# Patient Record
Sex: Female | Born: 1940 | Race: White | Hispanic: No | Smoking: Former smoker
Health system: Southern US, Community
[De-identification: ages and names within clinical notes are randomized; demographics above are authoritative.]

## PROBLEM LIST (undated history)

## (undated) DIAGNOSIS — N289 Disorder of kidney and ureter, unspecified: Secondary | ICD-10-CM

## (undated) DIAGNOSIS — J449 Chronic obstructive pulmonary disease, unspecified: Secondary | ICD-10-CM

## (undated) DIAGNOSIS — I509 Heart failure, unspecified: Secondary | ICD-10-CM

## (undated) DIAGNOSIS — E119 Type 2 diabetes mellitus without complications: Secondary | ICD-10-CM

## (undated) DIAGNOSIS — I10 Essential (primary) hypertension: Secondary | ICD-10-CM

## (undated) DIAGNOSIS — I639 Cerebral infarction, unspecified: Secondary | ICD-10-CM

## (undated) DIAGNOSIS — I4891 Unspecified atrial fibrillation: Secondary | ICD-10-CM

## (undated) DIAGNOSIS — J9610 Chronic respiratory failure, unspecified whether with hypoxia or hypercapnia: Secondary | ICD-10-CM

---

## 2016-05-29 DIAGNOSIS — I10 Essential (primary) hypertension: Secondary | ICD-10-CM

## 2016-05-29 DIAGNOSIS — J441 Chronic obstructive pulmonary disease with (acute) exacerbation: Secondary | ICD-10-CM

## 2016-05-29 DIAGNOSIS — M069 Rheumatoid arthritis, unspecified: Secondary | ICD-10-CM

## 2016-05-29 DIAGNOSIS — J9601 Acute respiratory failure with hypoxia: Secondary | ICD-10-CM

## 2016-05-29 DIAGNOSIS — I509 Heart failure, unspecified: Secondary | ICD-10-CM

## 2016-05-29 DIAGNOSIS — N179 Acute kidney failure, unspecified: Secondary | ICD-10-CM | POA: Diagnosis not present

## 2016-05-29 DIAGNOSIS — N390 Urinary tract infection, site not specified: Secondary | ICD-10-CM | POA: Diagnosis not present

## 2016-05-29 DIAGNOSIS — E119 Type 2 diabetes mellitus without complications: Secondary | ICD-10-CM

## 2016-05-30 DIAGNOSIS — N179 Acute kidney failure, unspecified: Secondary | ICD-10-CM | POA: Diagnosis not present

## 2016-05-30 DIAGNOSIS — J441 Chronic obstructive pulmonary disease with (acute) exacerbation: Secondary | ICD-10-CM | POA: Diagnosis not present

## 2016-05-30 DIAGNOSIS — N390 Urinary tract infection, site not specified: Secondary | ICD-10-CM | POA: Diagnosis not present

## 2016-05-30 DIAGNOSIS — J9601 Acute respiratory failure with hypoxia: Secondary | ICD-10-CM | POA: Diagnosis not present

## 2016-05-31 DIAGNOSIS — N390 Urinary tract infection, site not specified: Secondary | ICD-10-CM | POA: Diagnosis not present

## 2016-05-31 DIAGNOSIS — J9601 Acute respiratory failure with hypoxia: Secondary | ICD-10-CM | POA: Diagnosis not present

## 2016-05-31 DIAGNOSIS — J441 Chronic obstructive pulmonary disease with (acute) exacerbation: Secondary | ICD-10-CM | POA: Diagnosis not present

## 2016-05-31 DIAGNOSIS — N179 Acute kidney failure, unspecified: Secondary | ICD-10-CM | POA: Diagnosis not present

## 2016-06-01 DIAGNOSIS — N179 Acute kidney failure, unspecified: Secondary | ICD-10-CM

## 2016-06-01 DIAGNOSIS — J440 Chronic obstructive pulmonary disease with acute lower respiratory infection: Secondary | ICD-10-CM

## 2016-06-01 DIAGNOSIS — N390 Urinary tract infection, site not specified: Secondary | ICD-10-CM

## 2016-06-01 DIAGNOSIS — J9601 Acute respiratory failure with hypoxia: Secondary | ICD-10-CM | POA: Diagnosis not present

## 2016-06-01 DIAGNOSIS — M069 Rheumatoid arthritis, unspecified: Secondary | ICD-10-CM

## 2016-06-01 DIAGNOSIS — J441 Chronic obstructive pulmonary disease with (acute) exacerbation: Secondary | ICD-10-CM | POA: Diagnosis not present

## 2016-06-01 DIAGNOSIS — J189 Pneumonia, unspecified organism: Secondary | ICD-10-CM | POA: Diagnosis not present

## 2016-06-01 DIAGNOSIS — E119 Type 2 diabetes mellitus without complications: Secondary | ICD-10-CM

## 2016-06-01 DIAGNOSIS — I509 Heart failure, unspecified: Secondary | ICD-10-CM

## 2016-06-02 DIAGNOSIS — J441 Chronic obstructive pulmonary disease with (acute) exacerbation: Secondary | ICD-10-CM | POA: Diagnosis not present

## 2016-06-02 DIAGNOSIS — N179 Acute kidney failure, unspecified: Secondary | ICD-10-CM | POA: Diagnosis not present

## 2016-06-02 DIAGNOSIS — J9601 Acute respiratory failure with hypoxia: Secondary | ICD-10-CM | POA: Diagnosis not present

## 2016-06-02 DIAGNOSIS — J189 Pneumonia, unspecified organism: Secondary | ICD-10-CM | POA: Diagnosis not present

## 2017-03-05 DIAGNOSIS — A419 Sepsis, unspecified organism: Secondary | ICD-10-CM

## 2017-03-05 DIAGNOSIS — E119 Type 2 diabetes mellitus without complications: Secondary | ICD-10-CM | POA: Diagnosis not present

## 2017-03-05 DIAGNOSIS — N390 Urinary tract infection, site not specified: Secondary | ICD-10-CM

## 2017-03-05 DIAGNOSIS — N179 Acute kidney failure, unspecified: Secondary | ICD-10-CM | POA: Diagnosis not present

## 2017-03-05 DIAGNOSIS — I509 Heart failure, unspecified: Secondary | ICD-10-CM | POA: Diagnosis not present

## 2017-03-05 DIAGNOSIS — J441 Chronic obstructive pulmonary disease with (acute) exacerbation: Secondary | ICD-10-CM | POA: Diagnosis not present

## 2017-03-05 DIAGNOSIS — J9621 Acute and chronic respiratory failure with hypoxia: Secondary | ICD-10-CM | POA: Diagnosis not present

## 2017-03-05 DIAGNOSIS — I10 Essential (primary) hypertension: Secondary | ICD-10-CM | POA: Diagnosis not present

## 2017-03-05 DIAGNOSIS — M069 Rheumatoid arthritis, unspecified: Secondary | ICD-10-CM | POA: Diagnosis not present

## 2017-03-05 DIAGNOSIS — J440 Chronic obstructive pulmonary disease with acute lower respiratory infection: Secondary | ICD-10-CM

## 2017-03-05 DIAGNOSIS — R531 Weakness: Secondary | ICD-10-CM

## 2017-03-06 DIAGNOSIS — J441 Chronic obstructive pulmonary disease with (acute) exacerbation: Secondary | ICD-10-CM | POA: Diagnosis not present

## 2017-03-06 DIAGNOSIS — J440 Chronic obstructive pulmonary disease with acute lower respiratory infection: Secondary | ICD-10-CM | POA: Diagnosis not present

## 2017-03-06 DIAGNOSIS — M069 Rheumatoid arthritis, unspecified: Secondary | ICD-10-CM | POA: Diagnosis not present

## 2017-03-06 DIAGNOSIS — R531 Weakness: Secondary | ICD-10-CM | POA: Diagnosis not present

## 2017-03-06 DIAGNOSIS — E119 Type 2 diabetes mellitus without complications: Secondary | ICD-10-CM | POA: Diagnosis not present

## 2017-03-06 DIAGNOSIS — J9621 Acute and chronic respiratory failure with hypoxia: Secondary | ICD-10-CM | POA: Diagnosis not present

## 2017-03-06 DIAGNOSIS — N390 Urinary tract infection, site not specified: Secondary | ICD-10-CM | POA: Diagnosis not present

## 2017-03-06 DIAGNOSIS — N179 Acute kidney failure, unspecified: Secondary | ICD-10-CM | POA: Diagnosis not present

## 2017-03-06 DIAGNOSIS — I10 Essential (primary) hypertension: Secondary | ICD-10-CM | POA: Diagnosis not present

## 2017-03-06 DIAGNOSIS — A419 Sepsis, unspecified organism: Secondary | ICD-10-CM | POA: Diagnosis not present

## 2017-03-06 DIAGNOSIS — I509 Heart failure, unspecified: Secondary | ICD-10-CM | POA: Diagnosis not present

## 2017-03-07 DIAGNOSIS — J440 Chronic obstructive pulmonary disease with acute lower respiratory infection: Secondary | ICD-10-CM | POA: Diagnosis not present

## 2017-03-07 DIAGNOSIS — J441 Chronic obstructive pulmonary disease with (acute) exacerbation: Secondary | ICD-10-CM | POA: Diagnosis not present

## 2017-03-07 DIAGNOSIS — J9621 Acute and chronic respiratory failure with hypoxia: Secondary | ICD-10-CM | POA: Diagnosis not present

## 2017-03-07 DIAGNOSIS — N390 Urinary tract infection, site not specified: Secondary | ICD-10-CM | POA: Diagnosis not present

## 2017-03-07 DIAGNOSIS — I509 Heart failure, unspecified: Secondary | ICD-10-CM | POA: Diagnosis not present

## 2017-03-07 DIAGNOSIS — R531 Weakness: Secondary | ICD-10-CM | POA: Diagnosis not present

## 2017-03-07 DIAGNOSIS — E119 Type 2 diabetes mellitus without complications: Secondary | ICD-10-CM | POA: Diagnosis not present

## 2017-03-07 DIAGNOSIS — M069 Rheumatoid arthritis, unspecified: Secondary | ICD-10-CM | POA: Diagnosis not present

## 2017-03-07 DIAGNOSIS — I10 Essential (primary) hypertension: Secondary | ICD-10-CM | POA: Diagnosis not present

## 2017-03-07 DIAGNOSIS — N179 Acute kidney failure, unspecified: Secondary | ICD-10-CM | POA: Diagnosis not present

## 2017-03-07 DIAGNOSIS — A419 Sepsis, unspecified organism: Secondary | ICD-10-CM | POA: Diagnosis not present

## 2017-03-08 DIAGNOSIS — A419 Sepsis, unspecified organism: Secondary | ICD-10-CM | POA: Diagnosis not present

## 2017-03-08 DIAGNOSIS — N390 Urinary tract infection, site not specified: Secondary | ICD-10-CM | POA: Diagnosis not present

## 2017-03-08 DIAGNOSIS — M069 Rheumatoid arthritis, unspecified: Secondary | ICD-10-CM | POA: Diagnosis not present

## 2017-03-08 DIAGNOSIS — R531 Weakness: Secondary | ICD-10-CM | POA: Diagnosis not present

## 2017-03-08 DIAGNOSIS — J9621 Acute and chronic respiratory failure with hypoxia: Secondary | ICD-10-CM | POA: Diagnosis not present

## 2017-03-08 DIAGNOSIS — E119 Type 2 diabetes mellitus without complications: Secondary | ICD-10-CM | POA: Diagnosis not present

## 2017-03-08 DIAGNOSIS — N179 Acute kidney failure, unspecified: Secondary | ICD-10-CM | POA: Diagnosis not present

## 2017-03-08 DIAGNOSIS — J440 Chronic obstructive pulmonary disease with acute lower respiratory infection: Secondary | ICD-10-CM | POA: Diagnosis not present

## 2017-03-08 DIAGNOSIS — J441 Chronic obstructive pulmonary disease with (acute) exacerbation: Secondary | ICD-10-CM | POA: Diagnosis not present

## 2017-03-08 DIAGNOSIS — I509 Heart failure, unspecified: Secondary | ICD-10-CM | POA: Diagnosis not present

## 2017-03-08 DIAGNOSIS — I10 Essential (primary) hypertension: Secondary | ICD-10-CM | POA: Diagnosis not present

## 2017-03-24 DIAGNOSIS — J189 Pneumonia, unspecified organism: Secondary | ICD-10-CM

## 2017-03-24 DIAGNOSIS — I639 Cerebral infarction, unspecified: Secondary | ICD-10-CM | POA: Diagnosis not present

## 2017-03-24 DIAGNOSIS — R4182 Altered mental status, unspecified: Secondary | ICD-10-CM | POA: Diagnosis not present

## 2017-03-24 DIAGNOSIS — J9621 Acute and chronic respiratory failure with hypoxia: Secondary | ICD-10-CM

## 2017-03-25 DIAGNOSIS — R4182 Altered mental status, unspecified: Secondary | ICD-10-CM | POA: Diagnosis not present

## 2017-03-25 DIAGNOSIS — J189 Pneumonia, unspecified organism: Secondary | ICD-10-CM | POA: Diagnosis not present

## 2017-03-25 DIAGNOSIS — I639 Cerebral infarction, unspecified: Secondary | ICD-10-CM | POA: Diagnosis not present

## 2017-03-25 DIAGNOSIS — J9621 Acute and chronic respiratory failure with hypoxia: Secondary | ICD-10-CM | POA: Diagnosis not present

## 2017-03-26 DIAGNOSIS — J189 Pneumonia, unspecified organism: Secondary | ICD-10-CM | POA: Diagnosis not present

## 2017-03-26 DIAGNOSIS — I639 Cerebral infarction, unspecified: Secondary | ICD-10-CM | POA: Diagnosis not present

## 2017-03-26 DIAGNOSIS — R0602 Shortness of breath: Secondary | ICD-10-CM

## 2017-03-26 DIAGNOSIS — R4182 Altered mental status, unspecified: Secondary | ICD-10-CM | POA: Diagnosis not present

## 2017-03-26 DIAGNOSIS — J9621 Acute and chronic respiratory failure with hypoxia: Secondary | ICD-10-CM | POA: Diagnosis not present

## 2017-03-27 DIAGNOSIS — J9621 Acute and chronic respiratory failure with hypoxia: Secondary | ICD-10-CM | POA: Diagnosis not present

## 2017-03-27 DIAGNOSIS — R4182 Altered mental status, unspecified: Secondary | ICD-10-CM | POA: Diagnosis not present

## 2017-03-27 DIAGNOSIS — R0602 Shortness of breath: Secondary | ICD-10-CM

## 2017-03-27 DIAGNOSIS — J189 Pneumonia, unspecified organism: Secondary | ICD-10-CM | POA: Diagnosis not present

## 2017-03-27 DIAGNOSIS — I639 Cerebral infarction, unspecified: Secondary | ICD-10-CM | POA: Diagnosis not present

## 2017-03-28 DIAGNOSIS — R4182 Altered mental status, unspecified: Secondary | ICD-10-CM | POA: Diagnosis not present

## 2017-03-28 DIAGNOSIS — J189 Pneumonia, unspecified organism: Secondary | ICD-10-CM | POA: Diagnosis not present

## 2017-03-28 DIAGNOSIS — I639 Cerebral infarction, unspecified: Secondary | ICD-10-CM | POA: Diagnosis not present

## 2017-03-28 DIAGNOSIS — J9621 Acute and chronic respiratory failure with hypoxia: Secondary | ICD-10-CM | POA: Diagnosis not present

## 2017-03-29 DIAGNOSIS — J189 Pneumonia, unspecified organism: Secondary | ICD-10-CM | POA: Diagnosis not present

## 2017-03-29 DIAGNOSIS — R4182 Altered mental status, unspecified: Secondary | ICD-10-CM | POA: Diagnosis not present

## 2017-03-29 DIAGNOSIS — J9621 Acute and chronic respiratory failure with hypoxia: Secondary | ICD-10-CM | POA: Diagnosis not present

## 2017-03-29 DIAGNOSIS — I639 Cerebral infarction, unspecified: Secondary | ICD-10-CM | POA: Diagnosis not present

## 2017-03-30 DIAGNOSIS — I639 Cerebral infarction, unspecified: Secondary | ICD-10-CM | POA: Diagnosis not present

## 2017-03-30 DIAGNOSIS — J189 Pneumonia, unspecified organism: Secondary | ICD-10-CM | POA: Diagnosis not present

## 2017-03-30 DIAGNOSIS — R4182 Altered mental status, unspecified: Secondary | ICD-10-CM | POA: Diagnosis not present

## 2017-03-30 DIAGNOSIS — J9621 Acute and chronic respiratory failure with hypoxia: Secondary | ICD-10-CM | POA: Diagnosis not present

## 2017-04-26 ENCOUNTER — Ambulatory Visit: Payer: Medicare Other | Admitting: Neurology

## 2017-07-24 DIAGNOSIS — E119 Type 2 diabetes mellitus without complications: Secondary | ICD-10-CM | POA: Diagnosis not present

## 2017-07-24 DIAGNOSIS — R0902 Hypoxemia: Secondary | ICD-10-CM | POA: Diagnosis not present

## 2017-07-24 DIAGNOSIS — J441 Chronic obstructive pulmonary disease with (acute) exacerbation: Secondary | ICD-10-CM

## 2017-07-24 DIAGNOSIS — I10 Essential (primary) hypertension: Secondary | ICD-10-CM

## 2017-07-24 DIAGNOSIS — M069 Rheumatoid arthritis, unspecified: Secondary | ICD-10-CM | POA: Diagnosis not present

## 2017-07-25 DIAGNOSIS — E119 Type 2 diabetes mellitus without complications: Secondary | ICD-10-CM | POA: Diagnosis not present

## 2017-07-25 DIAGNOSIS — R0902 Hypoxemia: Secondary | ICD-10-CM | POA: Diagnosis not present

## 2017-07-25 DIAGNOSIS — J441 Chronic obstructive pulmonary disease with (acute) exacerbation: Secondary | ICD-10-CM | POA: Diagnosis not present

## 2017-07-25 DIAGNOSIS — I10 Essential (primary) hypertension: Secondary | ICD-10-CM | POA: Diagnosis not present

## 2017-07-25 DIAGNOSIS — M069 Rheumatoid arthritis, unspecified: Secondary | ICD-10-CM | POA: Diagnosis not present

## 2017-07-26 DIAGNOSIS — E119 Type 2 diabetes mellitus without complications: Secondary | ICD-10-CM | POA: Diagnosis not present

## 2017-07-26 DIAGNOSIS — R0902 Hypoxemia: Secondary | ICD-10-CM | POA: Diagnosis not present

## 2017-07-26 DIAGNOSIS — I10 Essential (primary) hypertension: Secondary | ICD-10-CM | POA: Diagnosis not present

## 2017-07-26 DIAGNOSIS — J441 Chronic obstructive pulmonary disease with (acute) exacerbation: Secondary | ICD-10-CM | POA: Diagnosis not present

## 2017-07-26 DIAGNOSIS — M069 Rheumatoid arthritis, unspecified: Secondary | ICD-10-CM | POA: Diagnosis not present

## 2017-07-27 DIAGNOSIS — I10 Essential (primary) hypertension: Secondary | ICD-10-CM | POA: Diagnosis not present

## 2017-07-27 DIAGNOSIS — E119 Type 2 diabetes mellitus without complications: Secondary | ICD-10-CM | POA: Diagnosis not present

## 2017-07-27 DIAGNOSIS — M069 Rheumatoid arthritis, unspecified: Secondary | ICD-10-CM | POA: Diagnosis not present

## 2017-07-27 DIAGNOSIS — R0902 Hypoxemia: Secondary | ICD-10-CM | POA: Diagnosis not present

## 2017-07-27 DIAGNOSIS — J441 Chronic obstructive pulmonary disease with (acute) exacerbation: Secondary | ICD-10-CM | POA: Diagnosis not present

## 2017-07-28 DIAGNOSIS — R0902 Hypoxemia: Secondary | ICD-10-CM | POA: Diagnosis not present

## 2017-07-28 DIAGNOSIS — M069 Rheumatoid arthritis, unspecified: Secondary | ICD-10-CM | POA: Diagnosis not present

## 2017-07-28 DIAGNOSIS — J441 Chronic obstructive pulmonary disease with (acute) exacerbation: Secondary | ICD-10-CM | POA: Diagnosis not present

## 2017-07-28 DIAGNOSIS — E119 Type 2 diabetes mellitus without complications: Secondary | ICD-10-CM | POA: Diagnosis not present

## 2017-07-28 DIAGNOSIS — I10 Essential (primary) hypertension: Secondary | ICD-10-CM | POA: Diagnosis not present

## 2017-08-27 DIAGNOSIS — J189 Pneumonia, unspecified organism: Secondary | ICD-10-CM

## 2017-08-27 DIAGNOSIS — E119 Type 2 diabetes mellitus without complications: Secondary | ICD-10-CM | POA: Diagnosis not present

## 2017-08-27 DIAGNOSIS — J9621 Acute and chronic respiratory failure with hypoxia: Secondary | ICD-10-CM

## 2017-08-27 DIAGNOSIS — R4182 Altered mental status, unspecified: Secondary | ICD-10-CM | POA: Diagnosis not present

## 2017-08-27 DIAGNOSIS — J441 Chronic obstructive pulmonary disease with (acute) exacerbation: Secondary | ICD-10-CM | POA: Diagnosis not present

## 2017-08-27 DIAGNOSIS — I10 Essential (primary) hypertension: Secondary | ICD-10-CM | POA: Diagnosis not present

## 2017-08-27 DIAGNOSIS — I639 Cerebral infarction, unspecified: Secondary | ICD-10-CM | POA: Diagnosis not present

## 2017-08-28 DIAGNOSIS — E119 Type 2 diabetes mellitus without complications: Secondary | ICD-10-CM | POA: Diagnosis not present

## 2017-08-28 DIAGNOSIS — R4182 Altered mental status, unspecified: Secondary | ICD-10-CM | POA: Diagnosis not present

## 2017-08-28 DIAGNOSIS — I10 Essential (primary) hypertension: Secondary | ICD-10-CM | POA: Diagnosis not present

## 2017-08-28 DIAGNOSIS — J189 Pneumonia, unspecified organism: Secondary | ICD-10-CM | POA: Diagnosis not present

## 2017-08-28 DIAGNOSIS — J441 Chronic obstructive pulmonary disease with (acute) exacerbation: Secondary | ICD-10-CM | POA: Diagnosis not present

## 2017-08-28 DIAGNOSIS — J9621 Acute and chronic respiratory failure with hypoxia: Secondary | ICD-10-CM | POA: Diagnosis not present

## 2017-08-28 DIAGNOSIS — I639 Cerebral infarction, unspecified: Secondary | ICD-10-CM | POA: Diagnosis not present

## 2017-08-29 DIAGNOSIS — J189 Pneumonia, unspecified organism: Secondary | ICD-10-CM | POA: Diagnosis not present

## 2017-08-29 DIAGNOSIS — J441 Chronic obstructive pulmonary disease with (acute) exacerbation: Secondary | ICD-10-CM | POA: Diagnosis not present

## 2017-08-29 DIAGNOSIS — I10 Essential (primary) hypertension: Secondary | ICD-10-CM | POA: Diagnosis not present

## 2017-08-29 DIAGNOSIS — E119 Type 2 diabetes mellitus without complications: Secondary | ICD-10-CM | POA: Diagnosis not present

## 2017-08-29 DIAGNOSIS — J9621 Acute and chronic respiratory failure with hypoxia: Secondary | ICD-10-CM | POA: Diagnosis not present

## 2017-08-29 DIAGNOSIS — R4182 Altered mental status, unspecified: Secondary | ICD-10-CM | POA: Diagnosis not present

## 2017-08-29 DIAGNOSIS — I639 Cerebral infarction, unspecified: Secondary | ICD-10-CM | POA: Diagnosis not present

## 2017-08-30 DIAGNOSIS — R4182 Altered mental status, unspecified: Secondary | ICD-10-CM | POA: Diagnosis not present

## 2017-08-30 DIAGNOSIS — I10 Essential (primary) hypertension: Secondary | ICD-10-CM | POA: Diagnosis not present

## 2017-08-30 DIAGNOSIS — E119 Type 2 diabetes mellitus without complications: Secondary | ICD-10-CM | POA: Diagnosis not present

## 2017-08-30 DIAGNOSIS — J9621 Acute and chronic respiratory failure with hypoxia: Secondary | ICD-10-CM | POA: Diagnosis not present

## 2017-08-30 DIAGNOSIS — I639 Cerebral infarction, unspecified: Secondary | ICD-10-CM | POA: Diagnosis not present

## 2017-08-30 DIAGNOSIS — J189 Pneumonia, unspecified organism: Secondary | ICD-10-CM | POA: Diagnosis not present

## 2017-08-30 DIAGNOSIS — J441 Chronic obstructive pulmonary disease with (acute) exacerbation: Secondary | ICD-10-CM | POA: Diagnosis not present

## 2017-08-31 DIAGNOSIS — J189 Pneumonia, unspecified organism: Secondary | ICD-10-CM | POA: Diagnosis not present

## 2017-08-31 DIAGNOSIS — E119 Type 2 diabetes mellitus without complications: Secondary | ICD-10-CM | POA: Diagnosis not present

## 2017-08-31 DIAGNOSIS — I639 Cerebral infarction, unspecified: Secondary | ICD-10-CM | POA: Diagnosis not present

## 2017-08-31 DIAGNOSIS — R4182 Altered mental status, unspecified: Secondary | ICD-10-CM | POA: Diagnosis not present

## 2017-08-31 DIAGNOSIS — J9621 Acute and chronic respiratory failure with hypoxia: Secondary | ICD-10-CM | POA: Diagnosis not present

## 2017-08-31 DIAGNOSIS — I10 Essential (primary) hypertension: Secondary | ICD-10-CM | POA: Diagnosis not present

## 2017-08-31 DIAGNOSIS — J441 Chronic obstructive pulmonary disease with (acute) exacerbation: Secondary | ICD-10-CM | POA: Diagnosis not present

## 2017-10-16 DIAGNOSIS — J449 Chronic obstructive pulmonary disease, unspecified: Secondary | ICD-10-CM

## 2017-10-16 DIAGNOSIS — I509 Heart failure, unspecified: Secondary | ICD-10-CM | POA: Diagnosis not present

## 2017-10-16 DIAGNOSIS — I209 Angina pectoris, unspecified: Secondary | ICD-10-CM

## 2017-10-16 DIAGNOSIS — E119 Type 2 diabetes mellitus without complications: Secondary | ICD-10-CM

## 2017-10-16 DIAGNOSIS — I10 Essential (primary) hypertension: Secondary | ICD-10-CM

## 2017-10-17 DIAGNOSIS — I10 Essential (primary) hypertension: Secondary | ICD-10-CM | POA: Diagnosis not present

## 2017-10-17 DIAGNOSIS — J449 Chronic obstructive pulmonary disease, unspecified: Secondary | ICD-10-CM | POA: Diagnosis not present

## 2017-10-17 DIAGNOSIS — D739 Disease of spleen, unspecified: Secondary | ICD-10-CM | POA: Diagnosis not present

## 2017-10-17 DIAGNOSIS — I209 Angina pectoris, unspecified: Secondary | ICD-10-CM | POA: Diagnosis not present

## 2017-10-17 DIAGNOSIS — E119 Type 2 diabetes mellitus without complications: Secondary | ICD-10-CM | POA: Diagnosis not present

## 2017-10-17 DIAGNOSIS — I509 Heart failure, unspecified: Secondary | ICD-10-CM | POA: Diagnosis not present

## 2019-01-04 ENCOUNTER — Other Ambulatory Visit: Payer: Self-pay | Admitting: Internal Medicine

## 2019-01-04 DIAGNOSIS — R200 Anesthesia of skin: Secondary | ICD-10-CM

## 2019-01-04 DIAGNOSIS — M542 Cervicalgia: Secondary | ICD-10-CM

## 2019-02-05 ENCOUNTER — Other Ambulatory Visit: Payer: Self-pay | Admitting: Internal Medicine

## 2019-02-07 ENCOUNTER — Other Ambulatory Visit: Payer: Self-pay | Admitting: Internal Medicine

## 2019-02-07 ENCOUNTER — Ambulatory Visit
Admission: RE | Admit: 2019-02-07 | Discharge: 2019-02-07 | Disposition: A | Discharge status: Home / Self Care | Payer: Medicare Other | Source: Ambulatory Visit | Attending: Internal Medicine | Admitting: Internal Medicine

## 2019-02-07 DIAGNOSIS — M542 Cervicalgia: Secondary | ICD-10-CM

## 2019-02-07 DIAGNOSIS — R200 Anesthesia of skin: Secondary | ICD-10-CM

## 2019-05-02 ENCOUNTER — Emergency Department (HOSPITAL_COMMUNITY): Payer: Medicare Other

## 2019-05-02 ENCOUNTER — Inpatient Hospital Stay (HOSPITAL_COMMUNITY)
Admission: EM | Admit: 2019-05-02 | Discharge: 2019-05-07 | DRG: 177 | Disposition: A | Discharge status: Home / Self Care | Payer: Medicare Other | Source: Skilled Nursing Facility | Attending: Internal Medicine | Admitting: Internal Medicine

## 2019-05-02 ENCOUNTER — Other Ambulatory Visit: Payer: Self-pay

## 2019-05-02 ENCOUNTER — Encounter (HOSPITAL_COMMUNITY): Payer: Self-pay | Admitting: Emergency Medicine

## 2019-05-02 DIAGNOSIS — I130 Hypertensive heart and chronic kidney disease with heart failure and stage 1 through stage 4 chronic kidney disease, or unspecified chronic kidney disease: Secondary | ICD-10-CM | POA: Diagnosis present

## 2019-05-02 DIAGNOSIS — Z7952 Long term (current) use of systemic steroids: Secondary | ICD-10-CM | POA: Diagnosis not present

## 2019-05-02 DIAGNOSIS — J9621 Acute and chronic respiratory failure with hypoxia: Secondary | ICD-10-CM | POA: Diagnosis present

## 2019-05-02 DIAGNOSIS — Z79899 Other long term (current) drug therapy: Secondary | ICD-10-CM | POA: Diagnosis not present

## 2019-05-02 DIAGNOSIS — Z8679 Personal history of other diseases of the circulatory system: Secondary | ICD-10-CM | POA: Diagnosis not present

## 2019-05-02 DIAGNOSIS — I10 Essential (primary) hypertension: Secondary | ICD-10-CM | POA: Diagnosis not present

## 2019-05-02 DIAGNOSIS — I69391 Dysphagia following cerebral infarction: Secondary | ICD-10-CM | POA: Diagnosis not present

## 2019-05-02 DIAGNOSIS — Z885 Allergy status to narcotic agent status: Secondary | ICD-10-CM

## 2019-05-02 DIAGNOSIS — I509 Heart failure, unspecified: Secondary | ICD-10-CM | POA: Diagnosis present

## 2019-05-02 DIAGNOSIS — U071 COVID-19: Secondary | ICD-10-CM | POA: Diagnosis present

## 2019-05-02 DIAGNOSIS — R0902 Hypoxemia: Secondary | ICD-10-CM | POA: Diagnosis present

## 2019-05-02 DIAGNOSIS — E1122 Type 2 diabetes mellitus with diabetic chronic kidney disease: Secondary | ICD-10-CM | POA: Diagnosis present

## 2019-05-02 DIAGNOSIS — Z79891 Long term (current) use of opiate analgesic: Secondary | ICD-10-CM | POA: Diagnosis not present

## 2019-05-02 DIAGNOSIS — K219 Gastro-esophageal reflux disease without esophagitis: Secondary | ICD-10-CM | POA: Diagnosis present

## 2019-05-02 DIAGNOSIS — Z881 Allergy status to other antibiotic agents status: Secondary | ICD-10-CM | POA: Diagnosis not present

## 2019-05-02 DIAGNOSIS — R001 Bradycardia, unspecified: Secondary | ICD-10-CM | POA: Diagnosis not present

## 2019-05-02 DIAGNOSIS — I69398 Other sequelae of cerebral infarction: Secondary | ICD-10-CM | POA: Diagnosis not present

## 2019-05-02 DIAGNOSIS — J440 Chronic obstructive pulmonary disease with acute lower respiratory infection: Secondary | ICD-10-CM | POA: Diagnosis present

## 2019-05-02 DIAGNOSIS — E1142 Type 2 diabetes mellitus with diabetic polyneuropathy: Secondary | ICD-10-CM | POA: Diagnosis present

## 2019-05-02 DIAGNOSIS — F329 Major depressive disorder, single episode, unspecified: Secondary | ICD-10-CM | POA: Diagnosis present

## 2019-05-02 DIAGNOSIS — J1289 Other viral pneumonia: Secondary | ICD-10-CM | POA: Diagnosis present

## 2019-05-02 DIAGNOSIS — I480 Paroxysmal atrial fibrillation: Secondary | ICD-10-CM | POA: Diagnosis present

## 2019-05-02 DIAGNOSIS — N184 Chronic kidney disease, stage 4 (severe): Secondary | ICD-10-CM | POA: Diagnosis present

## 2019-05-02 DIAGNOSIS — D696 Thrombocytopenia, unspecified: Secondary | ICD-10-CM | POA: Diagnosis present

## 2019-05-02 DIAGNOSIS — F419 Anxiety disorder, unspecified: Secondary | ICD-10-CM | POA: Diagnosis present

## 2019-05-02 DIAGNOSIS — K2100 Gastro-esophageal reflux disease with esophagitis, without bleeding: Secondary | ICD-10-CM | POA: Diagnosis not present

## 2019-05-02 DIAGNOSIS — Z9981 Dependence on supplemental oxygen: Secondary | ICD-10-CM

## 2019-05-02 DIAGNOSIS — Z66 Do not resuscitate: Secondary | ICD-10-CM | POA: Diagnosis not present

## 2019-05-02 DIAGNOSIS — Z886 Allergy status to analgesic agent status: Secondary | ICD-10-CM

## 2019-05-02 DIAGNOSIS — Z87891 Personal history of nicotine dependence: Secondary | ICD-10-CM | POA: Diagnosis not present

## 2019-05-02 DIAGNOSIS — J1282 Pneumonia due to coronavirus disease 2019: Secondary | ICD-10-CM | POA: Diagnosis present

## 2019-05-02 HISTORY — DX: Disorder of kidney and ureter, unspecified: N28.9

## 2019-05-02 HISTORY — DX: Chronic obstructive pulmonary disease, unspecified: J44.9

## 2019-05-02 HISTORY — DX: Heart failure, unspecified: I50.9

## 2019-05-02 HISTORY — DX: Unspecified atrial fibrillation: I48.91

## 2019-05-02 HISTORY — DX: Essential (primary) hypertension: I10

## 2019-05-02 HISTORY — DX: Cerebral infarction, unspecified: I63.9

## 2019-05-02 HISTORY — DX: Type 2 diabetes mellitus without complications: E11.9

## 2019-05-02 HISTORY — DX: Chronic respiratory failure, unspecified whether with hypoxia or hypercapnia: J96.10

## 2019-05-02 LAB — POCT I-STAT 7, (LYTES, BLD GAS, ICA,H+H)
Acid-Base Excess: 6 mmol/L — ABNORMAL HIGH (ref 0.0–2.0)
Bicarbonate: 32 mmol/L — ABNORMAL HIGH (ref 20.0–28.0)
Calcium, Ion: 1.15 mmol/L (ref 1.15–1.40)
HCT: 35 % — ABNORMAL LOW (ref 36.0–46.0)
Hemoglobin: 11.9 g/dL — ABNORMAL LOW (ref 12.0–15.0)
O2 Saturation: 99 %
Patient temperature: 98.6
Potassium: 3.7 mmol/L (ref 3.5–5.1)
Sodium: 136 mmol/L (ref 135–145)
TCO2: 34 mmol/L — ABNORMAL HIGH (ref 22–32)
pCO2 arterial: 51.5 mmHg — ABNORMAL HIGH (ref 32.0–48.0)
pH, Arterial: 7.402 (ref 7.350–7.450)
pO2, Arterial: 139 mmHg — ABNORMAL HIGH (ref 83.0–108.0)

## 2019-05-02 LAB — COMPREHENSIVE METABOLIC PANEL
ALT: 25 U/L (ref 0–44)
AST: 30 U/L (ref 15–41)
Albumin: 3.1 g/dL — ABNORMAL LOW (ref 3.5–5.0)
Alkaline Phosphatase: 60 U/L (ref 38–126)
Anion gap: 10 (ref 5–15)
BUN: 28 mg/dL — ABNORMAL HIGH (ref 8–23)
CO2: 30 mmol/L (ref 22–32)
Calcium: 8.2 mg/dL — ABNORMAL LOW (ref 8.9–10.3)
Chloride: 97 mmol/L — ABNORMAL LOW (ref 98–111)
Creatinine, Ser: 2.01 mg/dL — ABNORMAL HIGH (ref 0.44–1.00)
GFR calc Af Amer: 27 mL/min — ABNORMAL LOW (ref >60)
GFR calc non Af Amer: 23 mL/min — ABNORMAL LOW (ref >60)
Glucose, Bld: 120 mg/dL — ABNORMAL HIGH (ref 70–99)
Potassium: 4 mmol/L (ref 3.5–5.1)
Sodium: 137 mmol/L (ref 135–145)
Total Bilirubin: 0.9 mg/dL (ref 0.3–1.2)
Total Protein: 6.1 g/dL — ABNORMAL LOW (ref 6.5–8.1)

## 2019-05-02 LAB — BRAIN NATRIURETIC PEPTIDE: B Natriuretic Peptide: 214.4 pg/mL — ABNORMAL HIGH (ref 0.0–100.0)

## 2019-05-02 LAB — LACTIC ACID, PLASMA
Lactic Acid, Venous: 1.2 mmol/L (ref 0.5–1.9)
Lactic Acid, Venous: 0.8 mmol/L (ref 0.5–1.9)

## 2019-05-02 LAB — CBC
HCT: 37 % (ref 36.0–46.0)
Hemoglobin: 12 g/dL (ref 12.0–15.0)
MCH: 31.4 pg (ref 26.0–34.0)
MCHC: 32.4 g/dL (ref 30.0–36.0)
MCV: 96.9 fL (ref 80.0–100.0)
Platelets: 146 10*3/uL — ABNORMAL LOW (ref 150–400)
RBC: 3.82 MIL/uL — ABNORMAL LOW (ref 3.87–5.11)
RDW: 17.1 % — ABNORMAL HIGH (ref 11.5–15.5)
WBC: 7.6 10*3/uL (ref 4.0–10.5)
nRBC: 0 % (ref 0.0–0.2)

## 2019-05-02 LAB — FERRITIN: Ferritin: 66 ng/mL (ref 11–307)

## 2019-05-02 LAB — C-REACTIVE PROTEIN: CRP: 7.6 mg/dL — ABNORMAL HIGH (ref <1.0)

## 2019-05-02 LAB — TRIGLYCERIDES: Triglycerides: 88 mg/dL

## 2019-05-02 LAB — D-DIMER, QUANTITATIVE: D-Dimer, Quant: 1.11 ug/mL-FEU — ABNORMAL HIGH (ref 0.00–0.50)

## 2019-05-02 LAB — HEPATITIS B SURFACE ANTIGEN: Hepatitis B Surface Ag: NONREACTIVE

## 2019-05-02 LAB — TROPONIN I (HIGH SENSITIVITY)
Troponin I (High Sensitivity): 15 ng/L (ref <18)
Troponin I (High Sensitivity): 14 ng/L (ref <18)

## 2019-05-02 LAB — PROCALCITONIN: Procalcitonin: 0.13 ng/mL

## 2019-05-02 LAB — FIBRINOGEN: Fibrinogen: 551 mg/dL — ABNORMAL HIGH (ref 210–475)

## 2019-05-02 LAB — CBG MONITORING, ED
Glucose-Capillary: 91 mg/dL (ref 70–99)
Glucose-Capillary: 127 mg/dL — ABNORMAL HIGH (ref 70–99)

## 2019-05-02 LAB — HEMOGLOBIN A1C
Hgb A1c MFr Bld: 6.3 % — ABNORMAL HIGH (ref 4.8–5.6)
Mean Plasma Glucose: 134.11 mg/dL

## 2019-05-02 LAB — ABO/RH: ABO/RH(D): A POS

## 2019-05-02 LAB — POC SARS CORONAVIRUS 2 AG -  ED: SARS Coronavirus 2 Ag: POSITIVE — AB

## 2019-05-02 LAB — LACTATE DEHYDROGENASE: LDH: 209 U/L — ABNORMAL HIGH (ref 98–192)

## 2019-05-02 MED ORDER — SODIUM CHLORIDE 0.9% FLUSH
3.0000 mL | Freq: Two times a day (BID) | INTRAVENOUS | Status: DC
  Administered 2019-05-02 – 2019-05-07 (×10): 3 mL via INTRAVENOUS

## 2019-05-02 MED ORDER — LATANOPROST 0.005 % OP SOLN
1.0000 [drp] | Freq: Every day | OPHTHALMIC | Status: DC
  Administered 2019-05-04 – 2019-05-06 (×3): 1 [drp] via OPHTHALMIC
  Filled 2019-05-02 (×2): qty 2.5

## 2019-05-02 MED ORDER — SODIUM CHLORIDE 0.9 % IV SOLN
Freq: Once | INTRAVENOUS | Status: AC
  Administered 2019-05-02: 17:00:00 via INTRAVENOUS

## 2019-05-02 MED ORDER — SODIUM CHLORIDE 0.9 % IV SOLN
200.0000 mg | Freq: Once | INTRAVENOUS | Status: AC
  Administered 2019-05-02: 200 mg via INTRAVENOUS
  Filled 2019-05-02: qty 40

## 2019-05-02 MED ORDER — BISACODYL 5 MG PO TBEC: 5.0000 mg | DELAYED_RELEASE_TABLET | Freq: Every day | ORAL | Status: DC | PRN

## 2019-05-02 MED ORDER — GABAPENTIN 100 MG PO CAPS
100.0000 mg | ORAL_CAPSULE | Freq: Two times a day (BID) | ORAL | Status: DC
  Administered 2019-05-02 – 2019-05-07 (×10): 100 mg via ORAL
  Filled 2019-05-02 (×10): qty 1

## 2019-05-02 MED ORDER — VITAMIN C 500 MG PO TABS
500.0000 mg | ORAL_TABLET | Freq: Every day | ORAL | Status: DC
  Administered 2019-05-02 – 2019-05-07 (×6): 500 mg via ORAL
  Filled 2019-05-02 (×5): qty 1

## 2019-05-02 MED ORDER — FUROSEMIDE 20 MG PO TABS
40.0000 mg | ORAL_TABLET | Freq: Every day | ORAL | Status: DC
  Administered 2019-05-03 – 2019-05-06 (×4): 40 mg via ORAL
  Filled 2019-05-02 (×4): qty 2

## 2019-05-02 MED ORDER — ACETAMINOPHEN 325 MG PO TABS
650.0000 mg | ORAL_TABLET | Freq: Four times a day (QID) | ORAL | Status: DC | PRN
  Administered 2019-05-02 – 2019-05-05 (×2): 650 mg via ORAL
  Filled 2019-05-02 (×2): qty 2

## 2019-05-02 MED ORDER — HEPARIN SODIUM (PORCINE) 5000 UNIT/ML IJ SOLN
5000.0000 [IU] | Freq: Three times a day (TID) | INTRAMUSCULAR | Status: DC
  Administered 2019-05-02 – 2019-05-07 (×15): 5000 [IU] via SUBCUTANEOUS
  Filled 2019-05-02 (×15): qty 1

## 2019-05-02 MED ORDER — FOLIC ACID 1 MG PO TABS
1.0000 mg | ORAL_TABLET | Freq: Every day | ORAL | Status: DC
  Administered 2019-05-03 – 2019-05-07 (×5): 1 mg via ORAL
  Filled 2019-05-02 (×5): qty 1

## 2019-05-02 MED ORDER — POLYETHYL GLYCOL-PROPYL GLYCOL 0.4-0.3 % OP SOLN: 1.0000 [drp] | Freq: Two times a day (BID) | OPHTHALMIC | Status: DC

## 2019-05-02 MED ORDER — INSULIN ASPART 100 UNIT/ML ~~LOC~~ SOLN
0.0000 [IU] | Freq: Every day | SUBCUTANEOUS | Status: DC
  Administered 2019-05-06: 2 [IU] via SUBCUTANEOUS

## 2019-05-02 MED ORDER — IPRATROPIUM-ALBUTEROL 20-100 MCG/ACT IN AERS
1.0000 | INHALATION_SPRAY | Freq: Four times a day (QID) | RESPIRATORY_TRACT | Status: DC
  Administered 2019-05-02 – 2019-05-07 (×20): 1 via RESPIRATORY_TRACT
  Filled 2019-05-02 (×2): qty 4

## 2019-05-02 MED ORDER — ESCITALOPRAM OXALATE 10 MG PO TABS
5.0000 mg | ORAL_TABLET | Freq: Every day | ORAL | Status: DC
  Administered 2019-05-03 – 2019-05-07 (×5): 5 mg via ORAL
  Filled 2019-05-02 (×5): qty 1

## 2019-05-02 MED ORDER — SODIUM CHLORIDE 0.9 % IV SOLN
100.0000 mg | Freq: Every day | INTRAVENOUS | Status: AC
  Administered 2019-05-03 – 2019-05-06 (×4): 100 mg via INTRAVENOUS
  Filled 2019-05-02 (×2): qty 20
  Filled 2019-05-02: qty 100
  Filled 2019-05-02 (×3): qty 20

## 2019-05-02 MED ORDER — LABETALOL HCL 100 MG PO TABS
100.0000 mg | ORAL_TABLET | Freq: Two times a day (BID) | ORAL | Status: DC
  Administered 2019-05-02 – 2019-05-04 (×4): 100 mg via ORAL
  Filled 2019-05-02 (×4): qty 1

## 2019-05-02 MED ORDER — DULOXETINE HCL 60 MG PO CPEP
60.0000 mg | ORAL_CAPSULE | Freq: Every day | ORAL | Status: DC
  Administered 2019-05-03 – 2019-05-07 (×5): 60 mg via ORAL
  Filled 2019-05-02 (×7): qty 1

## 2019-05-02 MED ORDER — ZINC SULFATE 220 (50 ZN) MG PO CAPS
220.0000 mg | ORAL_CAPSULE | Freq: Every day | ORAL | Status: DC
  Administered 2019-05-02 – 2019-05-07 (×6): 220 mg via ORAL
  Filled 2019-05-02 (×5): qty 1

## 2019-05-02 MED ORDER — AMLODIPINE BESYLATE 10 MG PO TABS
10.0000 mg | ORAL_TABLET | Freq: Every day | ORAL | Status: DC
  Administered 2019-05-03 – 2019-05-07 (×5): 10 mg via ORAL
  Filled 2019-05-02: qty 2
  Filled 2019-05-02 (×5): qty 1

## 2019-05-02 MED ORDER — ATORVASTATIN CALCIUM 40 MG PO TABS
40.0000 mg | ORAL_TABLET | Freq: Every day | ORAL | Status: DC
  Administered 2019-05-03 – 2019-05-07 (×5): 40 mg via ORAL
  Filled 2019-05-02 (×5): qty 1

## 2019-05-02 MED ORDER — PANTOPRAZOLE SODIUM 40 MG PO TBEC
40.0000 mg | DELAYED_RELEASE_TABLET | Freq: Every day | ORAL | Status: DC
  Administered 2019-05-03 – 2019-05-07 (×5): 40 mg via ORAL
  Filled 2019-05-02 (×5): qty 1

## 2019-05-02 MED ORDER — INSULIN ASPART 100 UNIT/ML ~~LOC~~ SOLN
0.0000 [IU] | Freq: Three times a day (TID) | SUBCUTANEOUS | Status: DC
  Administered 2019-05-03: 1 [IU] via SUBCUTANEOUS
  Administered 2019-05-03: 2 [IU] via SUBCUTANEOUS
  Administered 2019-05-03 – 2019-05-07 (×6): 1 [IU] via SUBCUTANEOUS

## 2019-05-02 MED ORDER — ONDANSETRON HCL 4 MG PO TABS: 4.0000 mg | ORAL_TABLET | Freq: Four times a day (QID) | ORAL | Status: DC | PRN

## 2019-05-02 MED ORDER — MENTHOL (TOPICAL ANALGESIC) 5 % EX PTCH: 1.0000 | MEDICATED_PATCH | Freq: Two times a day (BID) | CUTANEOUS | Status: DC

## 2019-05-02 MED ORDER — BRIMONIDINE TARTRATE 0.15 % OP SOLN
1.0000 [drp] | Freq: Three times a day (TID) | OPHTHALMIC | Status: DC
  Administered 2019-05-03 – 2019-05-07 (×13): 1 [drp] via OPHTHALMIC
  Filled 2019-05-02 (×2): qty 5

## 2019-05-02 MED ORDER — OXYCODONE HCL 5 MG PO TABS
10.0000 mg | ORAL_TABLET | Freq: Four times a day (QID) | ORAL | Status: DC | PRN
  Administered 2019-05-03 – 2019-05-07 (×10): 10 mg via ORAL
  Filled 2019-05-02 (×11): qty 2

## 2019-05-02 MED ORDER — ONDANSETRON HCL 4 MG/2ML IJ SOLN: 4.0000 mg | Freq: Four times a day (QID) | INTRAMUSCULAR | Status: DC | PRN

## 2019-05-02 MED ORDER — MUSCLE RUB 10-15 % EX CREA
TOPICAL_CREAM | Freq: Two times a day (BID) | CUTANEOUS | Status: DC
  Administered 2019-05-03 – 2019-05-07 (×9): via TOPICAL
  Filled 2019-05-02 (×2): qty 85

## 2019-05-02 MED ORDER — DEXAMETHASONE SODIUM PHOSPHATE 10 MG/ML IJ SOLN
6.0000 mg | Freq: Every day | INTRAMUSCULAR | Status: AC
  Administered 2019-05-02 – 2019-05-05 (×4): 6 mg via INTRAVENOUS
  Filled 2019-05-02 (×6): qty 1

## 2019-05-02 MED ORDER — POLYVINYL ALCOHOL 1.4 % OP SOLN
1.0000 [drp] | Freq: Two times a day (BID) | OPHTHALMIC | Status: DC
  Administered 2019-05-03 – 2019-05-07 (×9): 1 [drp] via OPHTHALMIC
  Filled 2019-05-02 (×2): qty 15

## 2019-05-02 MED ORDER — GUAIFENESIN-DM 100-10 MG/5ML PO SYRP
10.0000 mL | ORAL_SOLUTION | ORAL | Status: DC | PRN
  Administered 2019-05-02: 10 mL via ORAL
  Filled 2019-05-02: qty 10

## 2019-05-02 MED ORDER — FAMOTIDINE IN NACL 20-0.9 MG/50ML-% IV SOLN
20.0000 mg | Freq: Every day | INTRAVENOUS | Status: DC
  Administered 2019-05-02: 20 mg via INTRAVENOUS
  Filled 2019-05-02: qty 50

## 2019-05-02 NOTE — ED Notes (Signed)
Green mittens placed on pt due to removal of oxygen

## 2019-05-02 NOTE — ED Triage Notes (Addendum)
Pt here from [facility] with c/o cough x2 weeks.  Per staff at facility pt has worsened the past 2 days with weakness and fever.  Pt is on 2L [place] at baseline. Currently on 4L [place].  Pt has a history of CHF and COPD.

## 2019-05-02 NOTE — ED Notes (Signed)
Pt pulling nasal cannula off and undressing.

## 2019-05-02 NOTE — ED Notes (Signed)
Patient nurse asking for an update [name] [phone]

## 2019-05-02 NOTE — H&P (Addendum)
History and Physical    Joyce Ortiz [id number] DOB: 1940-10-20 DOA: 05/02/2019  Referring MD/NP/PA: Pryor Curia, MD PCP: Bonnita Nasuti, MD  Patient coming from: Assisted living facility via EMS  Chief Complaint: Cough and shortness of breath  I have personally briefly reviewed patient's old medical records in [place]   HPI: Joyce Ortiz is a 78 y.o. female with medical history significant of COPD, oxygen dependent on 2 L nasal cannula oxygen at baseline, paroxysmal atrial fibrillation, DM type 2 with neuropathy, hypertension, CHF, CVA with residual weakness, and GERD.  She presents with complaints of 2-week history of productive cough and shortness of breath.  Patient was not able to give much history due to shortness of breath, but her daughter provide additional history over the phone.  Her daughter had talked to her last night and noted that she was wheezing and was very congested.  Over the last 2 days the patient had deteriorated and O2 saturations were noted to be in the 80s on her normal oxygen requirements of 2 L.  They had not formally tested her at the facility, but there were 12 of the residents that they had placed in quarantine yesterday.   ED Course: Upon admission into the emergency department patient was noted to have a temperature of 100.2 F, respiration 21-28, O2 saturations 97 to 100% on 4 L nasal cannula oxygen, and blood pressures maintained.  Labs significant for WBC 7.6, platelets 146, BUN 28, Cr 2.01 Chest x-ray showing bibasilar atelectasis.  ABG revealed pH 7.402, PCO2 51.5, PO2 139.  Review of Systems  Unable to perform ROS: Medical condition  Constitutional: Positive for fever.  HENT: Negative for ear pain.   Eyes: Negative for photophobia and pain.  Respiratory: Positive for cough, shortness of breath and wheezing.   Cardiovascular: Negative for chest pain and leg swelling.  Gastrointestinal: Negative for diarrhea and vomiting.  Genitourinary:  Negative for dysuria.  Musculoskeletal: Positive for myalgias.  Skin: Negative for rash.  Neurological: Positive for sensory change. Negative for loss of consciousness.  Psychiatric/Behavioral: Negative for substance abuse.  All other systems reviewed and are negative.   Past Medical History:  Diagnosis Date  . Atrial fibrillation ([place])   . CHF (congestive heart failure) ([place])   . COPD (chronic obstructive pulmonary disease) ([place])   . Diabetes mellitus type 2, controlled ([place])   . Hypertension   . Renal disorder   . Respiratory failure, chronic ([place])   . Stroke [facility])     History reviewed. No pertinent surgical history.  Remote history of tobacco use.  No significant alcohol or drug use.  Allergies  Allergen Reactions  . Aspirin     Other reaction(s): GI Upset (intolerance)  . Biaxin [Clarithromycin] Other (See Comments)    On MAR  . Codeine Nausea And Vomiting  . Moxifloxacin Itching    Family History  Problem Relation Age of Onset  . Cancer Father     No current facility-administered medications on file prior to encounter.    Current Outpatient Medications on File Prior to Encounter  Medication Sig Dispense Refill  . acetaminophen (TYLENOL) 325 MG tablet Take 650 mg by mouth every 4 (four) hours as needed for mild pain.    Marland Kitchen amLODipine (NORVASC) 10 MG tablet Take 10 mg by mouth daily.    Marland Kitchen atorvastatin (LIPITOR) 40 MG tablet Take 40 mg by mouth daily.    . bisacodyl (DULCOLAX) 5 MG EC tablet Take 5 mg by  mouth daily as needed for moderate constipation.    . brimonidine (ALPHAGAN P) 0.1 % SOLN Place 1 drop into both eyes 3 (three) times daily.    . DULoxetine (CYMBALTA) 60 MG capsule Take 60 mg by mouth daily.    Marland Kitchen escitalopram (LEXAPRO) 5 MG tablet Take 5 mg by mouth daily.    . folic acid (FOLVITE) 1 MG tablet Take 1 mg by mouth daily.    . furosemide (LASIX) 40 MG tablet Take 40 mg by mouth daily.    Marland Kitchen gabapentin (NEURONTIN) 100 MG capsule Take 100 mg by mouth 2  (two) times daily.    Marland Kitchen ipratropium-albuterol (DUONEB) 0.5-2.5 (3) MG/3ML SOLN Take 3 mLs by nebulization every 6 (six) hours as needed (wheezing/SOB).    Marland Kitchen labetalol (NORMODYNE) 100 MG tablet Take 100 mg by mouth 2 (two) times daily.    Marland Kitchen latanoprost (XALATAN) 0.005 % ophthalmic solution Place 1 drop into both eyes at bedtime.    . Menthol (ICY HOT) 5 % PTCH Apply 1 patch topically 2 (two) times daily.     . methotrexate (RHEUMATREX) 2.5 MG tablet Take 7.5 mg by mouth every Monday.    . Multiple Vitamins-Iron (MULTIVITAMINS WITH IRON) TABS tablet Take 1 tablet by mouth daily.    . ondansetron (ZOFRAN) 4 MG tablet Take 4 mg by mouth every 4 (four) hours as needed for nausea or vomiting.    Marland Kitchen oxycodone (OXY-IR) 5 MG capsule Take 10 mg by mouth every 6 (six) hours as needed for pain.    . pantoprazole (PROTONIX) 40 MG tablet Take 40 mg by mouth daily.    Vladimir Faster Glycol-Propyl Glycol (SYSTANE) 0.4-0.3 % SOLN Place 1 drop into both eyes 2 (two) times daily.    . predniSONE (DELTASONE) 5 MG tablet Take 5 mg by mouth daily.       Physical Exam:  Constitutional: Elderly female who appears to be acutely sick Vitals:   05/02/19 1445 05/02/19 1448 05/02/19 1500 05/02/19 1530  BP:      Pulse: (!) 58  (!) 57 (!) 59  Resp: (!) 23  (!) 21 19  Temp:      TempSrc:      SpO2: 100%  99% 99%  Weight:  68 kg    Height:  5\' 2"  (1.575 m)     Eyes: PERRL, lids and conjunctivae normal ENMT: Mucous membranes are dry.  Posterior pharynx clear of any exudate or lesions.  Neck: normal, supple, no masses, no thyromegaly Respiratory: Tachypneic with decreased overall aeration and expiratory wheezes appreciated.  Currently on 2 L nasal cannula oxygen maintaining O2 saturations.  Only able to talk in short sentences. Cardiovascular: Bradycardic, no murmurs / rubs / gallops. No extremity edema. 2+ pedal pulses. No carotid bruits.  Abdomen: no tenderness, no masses palpated. No hepatosplenomegaly. Bowel sounds  positive.  Musculoskeletal: no clubbing / cyanosis. No joint deformity upper and lower extremities. Good ROM, no contractures. Normal muscle tone.  Skin: no rashes, lesions, ulcers. No induration Neurologic: CN 2-12 grossly intact. Sensation intact, DTR normal. Strength 5/5 in all 4.  Psychiatric: Alert and oriented to self.  Patient seems somewhat confused.    Labs on Admission: I have personally reviewed following labs and imaging studies  CBC: Recent Labs  Lab 05/02/19 1338 05/02/19 1440  WBC  --  7.6  HGB 11.9* 12.0  HCT 35.0* 37.0  MCV  --  96.9  PLT  --  123456*   Basic Metabolic Panel: Recent  Labs  Lab 05/02/19 1338 05/02/19 1440  NA 136 137  K 3.7 4.0  CL  --  97*  CO2  --  30  GLUCOSE  --  120*  BUN  --  28*  CREATININE  --  2.01*  CALCIUM  --  8.2*   GFR: Estimated Creatinine Clearance: 20.5 mL/min (A) (by C-G formula based on SCr of 2.01 mg/dL (H)). Liver Function Tests: Recent Labs  Lab 05/02/19 1440  AST 30  ALT 25  ALKPHOS 60  BILITOT 0.9  PROT 6.1*  ALBUMIN 3.1*   No results for input(s): LIPASE, AMYLASE in the last 168 hours. No results for input(s): AMMONIA in the last 168 hours. Coagulation Profile: No results for input(s): INR, PROTIME in the last 168 hours. Cardiac Enzymes: No results for input(s): CKTOTAL, CKMB, CKMBINDEX, TROPONINI in the last 168 hours. BNP (last 3 results) No results for input(s): PROBNP in the last 8760 hours. HbA1C: No results for input(s): HGBA1C in the last 72 hours. CBG: No results for input(s): GLUCAP in the last 168 hours. Lipid Profile: Recent Labs    05/02/19 1440  TRIG 88   Thyroid Function Tests: No results for input(s): TSH, T4TOTAL, FREET4, T3FREE, THYROIDAB in the last 72 hours. Anemia Panel: Recent Labs    05/02/19 1440  FERRITIN 66   Urine analysis: No results found for: COLORURINE, APPEARANCEUR, LABSPEC, PHURINE, GLUCOSEU, HGBUR, BILIRUBINUR, KETONESUR, PROTEINUR, UROBILINOGEN, NITRITE,  LEUKOCYTESUR Sepsis Labs: No results found for this or any previous visit (from the past 240 hour(s)).   Radiological Exams on Admission: Dg Chest Port 1 View  Result Date: 05/02/2019 CLINICAL DATA:  Shortness of breath with cough and fever EXAM: PORTABLE CHEST 1 VIEW COMPARISON:  July 21, 2018 FINDINGS: There is mild bibasilar atelectasis. There is no edema or consolidation. Heart is upper normal in size with pulmonary vascularity normal. No adenopathy. There is aortic atherosclerosis. There is postoperative change in the lower cervical region. IMPRESSION: Bibasilar atelectasis. No edema or consolidation. Heart upper normal in size. No adenopathy. Aortic Atherosclerosis (ICD10-I70.0). Electronically Signed   By: Lowella Grip III M.D.   On: 05/02/2019 13:36    EKG: Independently reviewed.  Sinus rhythm at 60 bpm with premature atrial complexes  Assessment/Plan Acute on chronic respiratory failure with hypoxia secondary COVID-19 virus infection: Patient presents with complaints of productive cough and shortness of breath.  Point-of-care testing was positive for Covid-19.  Chest x-ray showing bibasilar atelectasis without signs of edema or consolidation.  Inflammatory markers including pro calcitonin 0.13, CRP 7.7, LDH 209, D-dimer 1.11, and lactic acid 1.2.  -Admit to a medical telemetry bed -COVID-19 order set utilized -Continuous pulse oximetry with nasal cannula oxygen to maintain O2 saturation greater than 90% -Follow-up blood and sputum cultures -Combivent inhaler -Decadron IV 6 mg daily -Remdesivir per pharmacy -Antitussives as needed -Vitamin C and zinc -Tylenol as needed for fever  Essential hypertension: Currently stable.  Home medications include amlodipine 10 mg daily, labetalol 100 mg twice daily, and furosemide 40 mg daily. -Continue home regimen as tolerated  Diabetes mellitus type 2 with polyneuropathy: On admission initial blood glucose 120. Patient appears to be  diet controlled and is not on any diabetic medications.  She has known diabetic polyneuropathy.  Hemoglobin A1c on care everywhere 5.8 in 02/2018.   -Hypoglycemic protocol -CBGs before every meal with sensitive SSI while on steroids -Adjust regimen as needed -Continue gabapentin  History of CHF: Patient reported to have a history of congestive heart failure.  No documentation currently found regarding last ejection fraction -Strict intake and output -Daily weights  Chronic kidney disease stage IV: Patient presents with creatinine 2.01 with BUN 28.  Review of records from care everywhere note creatinine of 1.7-1.8 from 02/2018.  Suspect this could possibly be -Give gentle IV fluids of normal saline IV fluids at 75 mL/h  Anxiety/depression -Continue Cymbalta and Lexapro  History of right MCA stroke with residual weakness: Records show patient has  Thrombocytopenia: Acute on chronic.  Platelet count 146 on admission, but review of records shows -Continue to monitor  GERD -Continue Protonix  DNR: Present on admission  DVT prophylaxis: heparin Code Status: DNR Family Communication: Discussed plan of care over the phone with the patient's daughter Disposition Plan: Likely discharge back to assisted living facility once medically stable Consults called: None Admission status: inpatient   Norval Morton MD [facility] Pager [phone]   If 7PM-7AM, please contact night-coverage [email or web address] Password [facility]  05/02/2019, 4:47 PM

## 2019-05-02 NOTE — ED Notes (Signed)
Daughter, Mena Pauls, [phone]. Called for pt updates.

## 2019-05-02 NOTE — ED Provider Notes (Signed)
[facility] EMERGENCY DEPARTMENT Provider Note   CSN: [id number] Arrival date & time:        History   Chief Complaint Chief Complaint  Patient presents with  . Cough  . Shortness of Breath  . Fever    HPI Joyce Ortiz is a 78 y.o. female.    Level 5 caveat HPI 78 yo female presents from long term care facility with reports that she has sob and new fever.  EMS reports patient has history of copd and chf, she is on 2 l n/c at baseline. EMS reports sats low on their arrival and oxygen increased to 4 l/m for transport. Patient answers some questions but it is unclear how much she hears or understands of questioning.   However, on reviewing DNR form, patient does clearly affirm that she wants neither intubation, cpr, o  Past Medical History:  Diagnosis Date  . Atrial fibrillation ([place])   . CHF (congestive heart failure) ([place])   . COPD (chronic obstructive pulmonary disease) ([place])   . Hypertension   . Renal disorder   . Respiratory failure, chronic ([place])   . Stroke [facility])     There are no active problems to display for this patient.   History reviewed. No pertinent surgical history.   OB History   No obstetric history on file.      Home Medications    Prior to Admission medications   Not on File    Family History No family history on file.  Social History Social History   Tobacco Use  . Smoking status: Not on file  Substance Use Topics  . Alcohol use: Not on file  . Drug use: Not on file     Allergies   Patient has no known allergies.   Review of Systems Review of Systems  All other systems reviewed and are negative.    Physical Exam Updated Vital Signs BP 139/61   Pulse 60   Temp 100.2 F (37.9 C) (Oral)   Resp (!) 27   SpO2 98%   Physical Exam Vitals signs and nursing note reviewed.  Constitutional:      Appearance: She is well-developed.  HENT:     Head: Normocephalic.     Mouth/Throat:     Comments: Dry  mucous membranes Eyes:     Pupils: Pupils are equal, round, and reactive to light.  Neck:     Musculoskeletal: Normal range of motion.  Cardiovascular:     Rate and Rhythm: Tachycardia present. Rhythm irregular.  Pulmonary:     Effort: Pulmonary effort is normal.     Breath sounds: Examination of the right-lower field reveals rhonchi. Examination of the left-lower field reveals rhonchi. Rhonchi present.  Abdominal:     General: Bowel sounds are normal.     Palpations: Abdomen is soft.  Musculoskeletal: Normal range of motion.     Right lower leg: She exhibits no tenderness. No edema.     Left lower leg: She exhibits no tenderness. No edema.  Skin:    General: Skin is warm and dry.     Capillary Refill: Capillary refill takes less than 2 seconds.  Neurological:     General: No focal deficit present.     Mental Status: She is alert.      ED Treatments / Results  Labs (all labs ordered are listed, but only abnormal results are displayed) Labs Reviewed  CULTURE, BLOOD (ROUTINE X 2)  CULTURE, BLOOD (ROUTINE X 2)  CBC  COMPREHENSIVE METABOLIC PANEL  BLOOD GAS, ARTERIAL  BRAIN NATRIURETIC PEPTIDE  POC SARS CORONAVIRUS 2 AG -  ED    EKG EKG Interpretation  Date/Time:  Thursday May 02 2019 13:15:21 EST Ventricular Rate:  60 PR Interval:    QRS Duration: 105 QT Interval:  406 QTC Calculation: 406 R Axis:   75 Text Interpretation: Sinus rhythm Atrial premature complex Nonspecific T abnrm, anterolateral leads Confirmed by Pattricia Boss [phone]) on 05/02/2019 3:07:36 PM   Radiology Dg Chest Port 1 View  Result Date: 05/02/2019 CLINICAL DATA:  Shortness of breath with cough and fever EXAM: PORTABLE CHEST 1 VIEW COMPARISON:  July 21, 2018 FINDINGS: There is mild bibasilar atelectasis. There is no edema or consolidation. Heart is upper normal in size with pulmonary vascularity normal. No adenopathy. There is aortic atherosclerosis. There is postoperative change in the  lower cervical region. IMPRESSION: Bibasilar atelectasis. No edema or consolidation. Heart upper normal in size. No adenopathy. Aortic Atherosclerosis (ICD10-I70.0). Electronically Signed   By: Lowella Grip III M.D.   On: 05/02/2019 13:36    Procedures .Critical Care Performed by: Pattricia Boss, MD Authorized by: Pattricia Boss, MD   Critical care provider statement:    Critical care time (minutes):  45   Critical care was necessary to treat or prevent imminent or life-threatening deterioration of the following conditions:  Respiratory failure   Critical care was time spent personally by me on the following activities:  Discussions with consultants, evaluation of patient's response to treatment, examination of patient, ordering and performing treatments and interventions, ordering and review of laboratory studies, ordering and review of radiographic studies, pulse oximetry, re-evaluation of patient's condition, obtaining history from patient or surrogate and review of old charts   (including critical care time)  Medications Ordered in ED Medications - No data to display   Initial Impression / Assessment and Plan / ED Course  I have reviewed the triage vital signs and the nursing notes.  Pertinent labs & imaging results that were available during my care of the patient were reviewed by me and considered in my medical decision making (see chart for details).      78 yo female ho chf, copd presents today with increased cough, fever, increased oxygen need and covid positive.  [place] for further treatment.  Discussed with Dr. Tamala Julian and will see for admission Joyce Ortiz was evaluated in Emergency Department on 05/02/2019 for the symptoms described in the history of present illness. She was evaluated in the context of the global COVID-19 pandemic, which necessitated consideration that the patient might be at risk for infection with the  SARS-CoV-2 virus that causes COVID-19. Institutional protocols and algorithms that pertain to the evaluation of patients at risk for COVID-19 are in a state of rapid change based on information released by regulatory bodies including the CDC and federal and state organizations. These policies and algorithms were followed during the patient's care in the ED.   Final Clinical Impressions(s) / ED Diagnoses   Final diagnoses:  [place]  Hypoxia    ED Discharge Orders    None       Pattricia Boss, MD 05/02/19 1544

## 2019-05-03 ENCOUNTER — Encounter (HOSPITAL_COMMUNITY): Payer: Self-pay

## 2019-05-03 DIAGNOSIS — U071 COVID-19: Secondary | ICD-10-CM | POA: Diagnosis not present

## 2019-05-03 LAB — CBC WITH DIFFERENTIAL/PLATELET
Abs Immature Granulocytes: 0.03 10*3/uL (ref 0.00–0.07)
Basophils Absolute: 0 10*3/uL (ref 0.0–0.1)
Basophils Relative: 0 %
Eosinophils Absolute: 0 10*3/uL (ref 0.0–0.5)
Eosinophils Relative: 0 %
HCT: 40.7 % (ref 36.0–46.0)
Hemoglobin: 13.2 g/dL (ref 12.0–15.0)
Immature Granulocytes: 1 %
Lymphocytes Relative: 7 %
Lymphs Abs: 0.4 10*3/uL — ABNORMAL LOW (ref 0.7–4.0)
MCH: 31.1 pg (ref 26.0–34.0)
MCHC: 32.4 g/dL (ref 30.0–36.0)
MCV: 96 fL (ref 80.0–100.0)
Monocytes Absolute: 0.3 10*3/uL (ref 0.1–1.0)
Monocytes Relative: 4 %
Neutro Abs: 5.2 10*3/uL (ref 1.7–7.7)
Neutrophils Relative %: 88 %
Platelets: 148 10*3/uL — ABNORMAL LOW (ref 150–400)
RBC: 4.24 MIL/uL (ref 3.87–5.11)
RDW: 17.1 % — ABNORMAL HIGH (ref 11.5–15.5)
WBC: 5.9 10*3/uL (ref 4.0–10.5)
nRBC: 0 % (ref 0.0–0.2)

## 2019-05-03 LAB — COMPREHENSIVE METABOLIC PANEL
ALT: 28 U/L (ref 0–44)
AST: 35 U/L (ref 15–41)
Albumin: 3.2 g/dL — ABNORMAL LOW (ref 3.5–5.0)
Alkaline Phosphatase: 62 U/L (ref 38–126)
Anion gap: 11 (ref 5–15)
BUN: 33 mg/dL — ABNORMAL HIGH (ref 8–23)
CO2: 28 mmol/L (ref 22–32)
Calcium: 8.4 mg/dL — ABNORMAL LOW (ref 8.9–10.3)
Chloride: 100 mmol/L (ref 98–111)
Creatinine, Ser: 1.94 mg/dL — ABNORMAL HIGH (ref 0.44–1.00)
GFR calc Af Amer: 28 mL/min — ABNORMAL LOW (ref >60)
GFR calc non Af Amer: 24 mL/min — ABNORMAL LOW (ref >60)
Glucose, Bld: 153 mg/dL — ABNORMAL HIGH (ref 70–99)
Potassium: 4.1 mmol/L (ref 3.5–5.1)
Sodium: 139 mmol/L (ref 135–145)
Total Bilirubin: 0.8 mg/dL (ref 0.3–1.2)
Total Protein: 6.3 g/dL — ABNORMAL LOW (ref 6.5–8.1)

## 2019-05-03 LAB — CBG MONITORING, ED: Glucose-Capillary: 126 mg/dL — ABNORMAL HIGH (ref 70–99)

## 2019-05-03 LAB — FERRITIN: Ferritin: 91 ng/mL (ref 11–307)

## 2019-05-03 LAB — C-REACTIVE PROTEIN: CRP: 12.2 mg/dL — ABNORMAL HIGH (ref <1.0)

## 2019-05-03 LAB — PHOSPHORUS: Phosphorus: 5.1 mg/dL — ABNORMAL HIGH (ref 2.5–4.6)

## 2019-05-03 LAB — PROCALCITONIN: Procalcitonin: 0.13 ng/mL

## 2019-05-03 LAB — TROPONIN I (HIGH SENSITIVITY): Troponin I (High Sensitivity): 12 ng/L (ref <18)

## 2019-05-03 LAB — GLUCOSE, CAPILLARY
Glucose-Capillary: 128 mg/dL — ABNORMAL HIGH (ref 70–99)
Glucose-Capillary: 173 mg/dL — ABNORMAL HIGH (ref 70–99)
Glucose-Capillary: 144 mg/dL — ABNORMAL HIGH (ref 70–99)

## 2019-05-03 LAB — MAGNESIUM: Magnesium: 2.1 mg/dL (ref 1.7–2.4)

## 2019-05-03 LAB — D-DIMER, QUANTITATIVE: D-Dimer, Quant: 1.51 ug/mL-FEU — ABNORMAL HIGH (ref 0.00–0.50)

## 2019-05-03 MED ORDER — FAMOTIDINE 20 MG PO TABS
20.0000 mg | ORAL_TABLET | Freq: Every day | ORAL | Status: DC
  Administered 2019-05-03 – 2019-05-06 (×4): 20 mg via ORAL
  Filled 2019-05-03 (×4): qty 1

## 2019-05-03 NOTE — ED Notes (Signed)
Lunch Tray Ordered @ 1020.  

## 2019-05-03 NOTE — Plan of Care (Signed)
  Problem: Education: Goal: Knowledge of risk factors and measures for prevention of condition will improve Outcome: Progressing   Problem: Coping: Goal: Psychosocial and spiritual needs will be supported Outcome: Progressing   Problem: Respiratory: Goal: Will maintain a patent airway Outcome: Progressing Goal: Complications related to the disease process, condition or treatment will be avoided or minimized Outcome: Progressing   

## 2019-05-03 NOTE — Progress Notes (Signed)
PROGRESS NOTE    Joyce Ortiz  [id number] DOB: [birth year] DOA: 05/02/2019 PCP: Bonnita Nasuti, MD   Brief Narrative: 78 year old with past medical history significant for COPD, oxygen dependent on 2 L at baseline, paroxysmal A. fib, diabetes type 2 with neuropathy, hypertension, CHF, CVA with residual weakness and GERD who presents complaining with 2 weeks history of productive cough and shortness of breath. The night prior to admission patient developed wheezing and was very congested.  Over the last 2 days her oxygen saturation were dropping into the 80s.  Patient comes from an assisted living facility were 12 of the residents were placed in quarantine.  Evaluation in the ED patient was found to have Covid positive, she is requiring 4 L of oxygen, tachypneic febrile chest x-ray showed bibasilar atelectasis. COVID-19 positive test     Assessment & Plan:   Principal Problem:   COVID-19 virus infection Active Problems:   Acute on chronic respiratory failure with hypoxia (HCC)   DNR (do not resuscitate)   GERD (gastroesophageal reflux disease)   Thrombocytopenia (HCC)   CKD (chronic kidney disease), stage IV (HCC)   History of CHF (congestive heart failure)   Type 2 diabetes mellitus with polyneuropathy ([place])   Essential hypertension   Pneumonia due to COVID-19 virus   1-Acute on Chronic Hypoxic respiratory failure secondary to COVID-19 infection: Patient presented with cough, shortness of breath.  Covid 19+. Continue with Decadron. Continue with Remdesivir day 2. Continue with vitamin C and zinc. Inflammatory markers continue to increase. Continue with Albuterol/ipratropium Q 6 hours.   COVID-19 Labs  Recent Labs    05/02/19 1440 05/03/19 0257  DDIMER 1.11* 1.51*  FERRITIN 66 91  LDH 209*  --   CRP 7.6* 12.2*    2-Chronic renal  disease stage IV: Prior creatinine per records in care everywhere creatinine 1.8-2.0 Present with a creatinine at 2.0.  Continue to  monitor on Lasix   3-Hypertension: Continue with amlodipine, labetalol and furosemide  4-Diabetes type 2 with polyneuropathy: Continue with  sliding scale insulin Continue with gabapentin.  5-History of CHF, no EF  available; Continue with lasix.   6-Anxiety/Depression; Continue with Cymbalta.  7-Acute on chronic thrombocytopenia; monitor.  8-DNR present on admission.    No results found for: [id number]  Estimated body mass index is 27.44 kg/m as calculated from the following:   Height as of this encounter: 5\' 2"  (1.575 m).   Weight as of this encounter: 68 kg.   DVT prophylaxis: Heparin  Code Status: DNR Family Communication: Spoke with Daughter Barnett Applebaum.  Disposition Plan: transfer to [facility], remain in the hospital for management of covid PNA, needs IV Remdesivir Consultants:   none  Procedures:  None.  Antimicrobials: Others Remdesivir 12-03.  Subjective: She is alert, report dyspnea, improved after oxygen was increased to 4 L.  She denies chest pain.    Objective: Vitals:   05/03/19 0539 05/03/19 0600 05/03/19 0630 05/03/19 0700  BP:  132/67 (!) 112/55 (!) 119/56  Pulse:  (!) 58 (!) 58 (!) 52  Resp:  19 15 13   Temp:      TempSrc:      SpO2: 94% 93% 96% 94%  Weight:      Height:        Intake/Output Summary (Last 24 hours) at 05/03/2019 0806 Last data filed at 05/03/2019 0038 Gross per 24 hour  Intake 600 ml  Output -  Net 600 ml   [employer]  05/02/19 1448  Weight: 68 kg    Examination:  General exam: Appears calm and comfortable , frail.  Respiratory system: bilateral ronchus, sporadic wheezing. Marland Kitchen Respiratory effort normal. Cardiovascular system: S1 & S2 heard, RRR.  Gastrointestinal system: Abdomen is nondistended, soft and nontender. No organomegaly or masses felt. Normal bowel sounds heard. Central nervous system: Alert and oriented. Extremities: Symmetric 5 x 5 power. Skin: No rashes, lesions or ulcers    Data  Reviewed: I have personally reviewed following labs and imaging studies  CBC: Recent Labs  Lab 05/02/19 1338 05/02/19 1440 05/03/19 0257  WBC  --  7.6 5.9  NEUTROABS  --   --  5.2  HGB 11.9* 12.0 13.2  HCT 35.0* 37.0 40.7  MCV  --  96.9 96.0  PLT  --  146* 123456*   Basic Metabolic Panel: Recent Labs  Lab 05/02/19 1338 05/02/19 1440 05/03/19 0257  NA 136 137 139  K 3.7 4.0 4.1  CL  --  97* 100  CO2  --  30 28  GLUCOSE  --  120* 153*  BUN  --  28* 33*  CREATININE  --  2.01* 1.94*  CALCIUM  --  8.2* 8.4*  MG  --   --  2.1  PHOS  --   --  5.1*   GFR: Estimated Creatinine Clearance: 21.3 mL/min (A) (by C-G formula based on SCr of 1.94 mg/dL (H)). Liver Function Tests: Recent Labs  Lab 05/02/19 1440 05/03/19 0257  AST 30 35  ALT 25 28  ALKPHOS 60 62  BILITOT 0.9 0.8  PROT 6.1* 6.3*  ALBUMIN 3.1* 3.2*   No results for input(s): LIPASE, AMYLASE in the last 168 hours. No results for input(s): AMMONIA in the last 168 hours. Coagulation Profile: No results for input(s): INR, PROTIME in the last 168 hours. Cardiac Enzymes: No results for input(s): CKTOTAL, CKMB, CKMBINDEX, TROPONINI in the last 168 hours. BNP (last 3 results) No results for input(s): PROBNP in the last 8760 hours. HbA1C: Recent Labs    05/02/19 1648  HGBA1C 6.3*   CBG: Recent Labs  Lab 05/02/19 1709 05/02/19 2221  GLUCAP 91 127*   Lipid Profile: Recent Labs    05/02/19 1440  TRIG 88   Thyroid Function Tests: No results for input(s): TSH, T4TOTAL, FREET4, T3FREE, THYROIDAB in the last 72 hours. Anemia Panel: Recent Labs    05/02/19 1440 05/03/19 0257  FERRITIN 66 91   Sepsis Labs: Recent Labs  Lab 05/02/19 1418 05/02/19 1440 05/02/19 1641 05/03/19 0257  PROCALCITON  --  0.13  --  0.13  LATICACIDVEN 1.2  --  0.8  --     No results found for this or any previous visit (from the past 240 hour(s)).       Radiology Studies: Dg Chest Port 1 View  Result Date: 05/02/2019  CLINICAL DATA:  Shortness of breath with cough and fever EXAM: PORTABLE CHEST 1 VIEW COMPARISON:  July 21, 2018 FINDINGS: There is mild bibasilar atelectasis. There is no edema or consolidation. Heart is upper normal in size with pulmonary vascularity normal. No adenopathy. There is aortic atherosclerosis. There is postoperative change in the lower cervical region. IMPRESSION: Bibasilar atelectasis. No edema or consolidation. Heart upper normal in size. No adenopathy. Aortic Atherosclerosis (ICD10-I70.0). Electronically Signed   By: Lowella Grip III M.D.   On: 05/02/2019 13:36        Scheduled Meds: . amLODipine  10 mg Oral Daily  . atorvastatin  40 mg  Oral Daily  . brimonidine  1 drop Both Eyes TID  . dexamethasone (DECADRON) injection  6 mg Intravenous Daily  . DULoxetine  60 mg Oral Daily  . escitalopram  5 mg Oral Daily  . folic acid  1 mg Oral Daily  . furosemide  40 mg Oral Daily  . gabapentin  100 mg Oral BID  . heparin injection (subcutaneous)  5,000 Units Subcutaneous Q8H  . insulin aspart  0-5 Units Subcutaneous QHS  . insulin aspart  0-9 Units Subcutaneous TID WC  . Ipratropium-Albuterol  1 puff Inhalation Q6H  . labetalol  100 mg Oral BID  . latanoprost  1 drop Both Eyes QHS  . Muscle Rub   Topical BID  . pantoprazole  40 mg Oral Daily  . polyvinyl alcohol  1 drop Both Eyes BID  . sodium chloride flush  3 mL Intravenous Q12H  . vitamin C  500 mg Oral Daily  . zinc sulfate  220 mg Oral Daily   Continuous Infusions: . famotidine (PEPCID) IV Stopped (05/03/19 0038)  . remdesivir 100 mg in NS 100 mL       LOS: 1 day    Time spent: 35 minutes.     Elmarie Shiley, MD [facility]   If 7PM-7AM, please contact night-coverage [email or web address] Password [email or web address] 05/03/2019, 8:06 AM

## 2019-05-03 NOTE — ED Notes (Signed)
Ordered breakfast--Joyce Ortiz 

## 2019-05-03 NOTE — Progress Notes (Signed)
Joyce Ortiz  [id number] DOB: Oct 31, 1940 DOA: 05/02/2019 PCP: Bonnita Nasuti, MD    Brief Narrative:  78 year old with a history of COPD, chronic hypoxic respiratory failure requiring 2 L nasal cannula, paroxysmal atrial fibrillation, DM 2 with neuropathy, HTN, CHF, CVA, and GERD who presented to the ED with a 2-week history of productive cough and shortness of breath.  She noted her oxygen saturation dropping into the 80s at home.  She resides at an assisted living facility.  In the ED she was found to be Covid positive and required 4 L of nasal cannula.  CXR noted bibasilar atelectasis.  Significant Events: 12/3 admit via [facility] -positive Covid test 12/4 transfer to [facility]  COVID-19 specific Treatment: Decadron 12/3 > Remdesivir 12/3 >  Subjective: Patient was seen for follow-up visit  Assessment & Plan:  COVID Pneumonia -acute on chronic hypoxic respiratory failure  Recent Labs  Lab 05/02/19 1440 05/03/19 0257  DDIMER 1.11* 1.51*  FERRITIN 66 91  CRP 7.6* 12.2*  ALT 25 28  PROCALCITON 0.13 0.13    CKD stage IV Baseline creatinine appears to be approximately 1.8-2.0  HTN  DM 2 with polyneuropathy  CHF  Anxiety/depression  Chronic thrombocytopenia  DVT prophylaxis: Subcutaneous heparin Code Status: NO CODE BLUE - DNR  Family Communication:  Disposition Plan:   Consultants:  none  Antimicrobials:  None  Objective: Blood pressure 111/78, pulse (!) 57, temperature 98.2 F (36.8 C), resp. rate 16, height 5\' 2"  (1.575 m), weight 68 kg, SpO2 99 %.  Intake/Output Summary (Last 24 hours) at 05/03/2019 1515 Last data filed at 05/03/2019 1015 Gross per 24 hour  Intake 600 ml  Output 800 ml  Net -200 ml   Filed Weights   05/02/19 1448  Weight: 68 kg    Examination: Patient seen for follow-up visit  CBC: Recent Labs  Lab 05/02/19 1338 05/02/19 1440 05/03/19 0257  WBC  --  7.6 5.9  NEUTROABS  --   --  5.2  HGB 11.9* 12.0 13.2   HCT 35.0* 37.0 40.7  MCV  --  96.9 96.0  PLT  --  146* 123456*   Basic Metabolic Panel: Recent Labs  Lab 05/02/19 1338 05/02/19 1440 05/03/19 0257  NA 136 137 139  K 3.7 4.0 4.1  CL  --  97* 100  CO2  --  30 28  GLUCOSE  --  120* 153*  BUN  --  28* 33*  CREATININE  --  2.01* 1.94*  CALCIUM  --  8.2* 8.4*  MG  --   --  2.1  PHOS  --   --  5.1*   GFR: Estimated Creatinine Clearance: 21.3 mL/min (A) (by C-G formula based on SCr of 1.94 mg/dL (H)).  Liver Function Tests: Recent Labs  Lab 05/02/19 1440 05/03/19 0257  AST 30 35  ALT 25 28  ALKPHOS 60 62  BILITOT 0.9 0.8  PROT 6.1* 6.3*  ALBUMIN 3.1* 3.2*    HbA1C: Hgb A1c MFr Bld  Date/Time Value Ref Range Status  05/02/2019 04:48 PM 6.3 (H) 4.8 - 5.6 % Final    Comment:    (NOTE) Pre diabetes:          5.7%-6.4% Diabetes:              >6.4% Glycemic control for   <7.0% adults with diabetes     CBG: Recent Labs  Lab 05/02/19 1709 05/02/19 2221 05/03/19 0816  GLUCAP 91 127* 126*  Recent Results (from the past 240 hour(s))  Blood culture (routine x 2)     Status: None (Preliminary result)   Collection Time: 05/02/19  2:28 PM   Specimen: BLOOD  Result Value Ref Range Status   Specimen Description BLOOD RIGHT ANTECUBITAL  Final   Special Requests   Final    BOTTLES DRAWN AEROBIC AND ANAEROBIC Blood Culture adequate volume   Culture   Final    NO GROWTH < 24 HOURS Performed at [facility], [address] [place], [place] [address]    Report Status PENDING  Incomplete  Blood culture (routine x 2)     Status: None (Preliminary result)   Collection Time: 05/02/19  2:30 PM   Specimen: BLOOD  Result Value Ref Range Status   Specimen Description BLOOD LEFT ANTECUBITAL  Final   Special Requests   Final    BOTTLES DRAWN AEROBIC ONLY Blood Culture results may not be optimal due to an inadequate volume of blood received in culture bottles   Culture   Final    NO GROWTH < 24 HOURS Performed at [facility], [place] [address], [place], [place] [address]    Report Status PENDING  Incomplete     Scheduled Meds: . amLODipine  10 mg Oral Daily  . atorvastatin  40 mg Oral Daily  . brimonidine  1 drop Both Eyes TID  . dexamethasone (DECADRON) injection  6 mg Intravenous Daily  . DULoxetine  60 mg Oral Daily  . escitalopram  5 mg Oral Daily  . folic acid  1 mg Oral Daily  . furosemide  40 mg Oral Daily  . gabapentin  100 mg Oral BID  . heparin injection (subcutaneous)  5,000 Units Subcutaneous Q8H  . insulin aspart  0-5 Units Subcutaneous QHS  . insulin aspart  0-9 Units Subcutaneous TID WC  . Ipratropium-Albuterol  1 puff Inhalation Q6H  . labetalol  100 mg Oral BID  . latanoprost  1 drop Both Eyes QHS  . Muscle Rub   Topical BID  . pantoprazole  40 mg Oral Daily  . polyvinyl alcohol  1 drop Both Eyes BID  . sodium chloride flush  3 mL Intravenous Q12H  . vitamin C  500 mg Oral Daily  . zinc sulfate  220 mg Oral Daily     LOS: 1 day   Cherene Altes, MD [facility] Office  [phone] Pager - Text Page per Amion  If 7PM-7AM, please contact night-coverage per Amion 05/03/2019, 3:15 PM

## 2019-05-04 ENCOUNTER — Inpatient Hospital Stay (HOSPITAL_COMMUNITY): Payer: Medicare Other

## 2019-05-04 DIAGNOSIS — U071 COVID-19: Secondary | ICD-10-CM | POA: Diagnosis not present

## 2019-05-04 LAB — COMPREHENSIVE METABOLIC PANEL
ALT: 29 U/L (ref 0–44)
AST: 36 U/L (ref 15–41)
Albumin: 2.9 g/dL — ABNORMAL LOW (ref 3.5–5.0)
Alkaline Phosphatase: 52 U/L (ref 38–126)
Anion gap: 14 (ref 5–15)
BUN: 54 mg/dL — ABNORMAL HIGH (ref 8–23)
CO2: 30 mmol/L (ref 22–32)
Calcium: 8.3 mg/dL — ABNORMAL LOW (ref 8.9–10.3)
Chloride: 98 mmol/L (ref 98–111)
Creatinine, Ser: 2.01 mg/dL — ABNORMAL HIGH (ref 0.44–1.00)
GFR calc Af Amer: 27 mL/min — ABNORMAL LOW (ref >60)
GFR calc non Af Amer: 23 mL/min — ABNORMAL LOW (ref >60)
Glucose, Bld: 133 mg/dL — ABNORMAL HIGH (ref 70–99)
Potassium: 4.1 mmol/L (ref 3.5–5.1)
Sodium: 142 mmol/L (ref 135–145)
Total Bilirubin: 0.5 mg/dL (ref 0.3–1.2)
Total Protein: 6.1 g/dL — ABNORMAL LOW (ref 6.5–8.1)

## 2019-05-04 LAB — GLUCOSE, CAPILLARY
Glucose-Capillary: 117 mg/dL — ABNORMAL HIGH (ref 70–99)
Glucose-Capillary: 122 mg/dL — ABNORMAL HIGH (ref 70–99)
Glucose-Capillary: 147 mg/dL — ABNORMAL HIGH (ref 70–99)
Glucose-Capillary: 158 mg/dL — ABNORMAL HIGH (ref 70–99)

## 2019-05-04 LAB — CBC WITH DIFFERENTIAL/PLATELET
Abs Immature Granulocytes: 0.03 10*3/uL (ref 0.00–0.07)
Basophils Absolute: 0 10*3/uL (ref 0.0–0.1)
Basophils Relative: 0 %
Eosinophils Absolute: 0 10*3/uL (ref 0.0–0.5)
Eosinophils Relative: 0 %
HCT: 35.9 % — ABNORMAL LOW (ref 36.0–46.0)
Hemoglobin: 11.6 g/dL — ABNORMAL LOW (ref 12.0–15.0)
Immature Granulocytes: 0 %
Lymphocytes Relative: 7 %
Lymphs Abs: 0.6 10*3/uL — ABNORMAL LOW (ref 0.7–4.0)
MCH: 31.4 pg (ref 26.0–34.0)
MCHC: 32.3 g/dL (ref 30.0–36.0)
MCV: 97.3 fL (ref 80.0–100.0)
Monocytes Absolute: 1 10*3/uL (ref 0.1–1.0)
Monocytes Relative: 11 %
Neutro Abs: 7.1 10*3/uL (ref 1.7–7.7)
Neutrophils Relative %: 82 %
Platelets: 162 10*3/uL (ref 150–400)
RBC: 3.69 MIL/uL — ABNORMAL LOW (ref 3.87–5.11)
RDW: 17.1 % — ABNORMAL HIGH (ref 11.5–15.5)
WBC: 8.7 10*3/uL (ref 4.0–10.5)
nRBC: 0 % (ref 0.0–0.2)

## 2019-05-04 LAB — FERRITIN: Ferritin: 127 ng/mL (ref 11–307)

## 2019-05-04 LAB — C-REACTIVE PROTEIN: CRP: 9.8 mg/dL — ABNORMAL HIGH (ref <1.0)

## 2019-05-04 LAB — PROCALCITONIN: Procalcitonin: 0.18 ng/mL

## 2019-05-04 LAB — MAGNESIUM: Magnesium: 2.3 mg/dL (ref 1.7–2.4)

## 2019-05-04 LAB — D-DIMER, QUANTITATIVE: D-Dimer, Quant: 1.14 ug/mL-FEU — ABNORMAL HIGH (ref 0.00–0.50)

## 2019-05-04 MED ORDER — LABETALOL HCL 100 MG PO TABS
50.0000 mg | ORAL_TABLET | Freq: Two times a day (BID) | ORAL | Status: DC
  Administered 2019-05-04 – 2019-05-07 (×5): 50 mg via ORAL
  Filled 2019-05-04: qty 0.5
  Filled 2019-05-04: qty 1
  Filled 2019-05-04 (×2): qty 0.5
  Filled 2019-05-04: qty 1
  Filled 2019-05-04 (×3): qty 0.5

## 2019-05-04 NOTE — Progress Notes (Signed)
Joyce Ortiz  [id number] DOB: 1940/07/24 DOA: 05/02/2019 PCP: Bonnita Nasuti, MD    Brief Narrative:  78 year old with a history of COPD, chronic hypoxic respiratory failure requiring 2 L nasal cannula, paroxysmal atrial fibrillation, DM 2 with neuropathy, HTN, CHF, Thrombotic CVA, and GERD who presented to the ED with a 2-week history of productive cough and shortness of breath.  She noted her oxygen saturation dropping into the 80s at her assisted living facility.  In the ED she was found to be Covid positive and required 4 L of nasal cannula suppor.  A CXR noted bibasilar atelectasis.  Significant Events: 12/3 admit via [facility] - positive Covid test 12/4 transfer to [facility]  COVID-19 specific Treatment: Decadron 12/3 > Remdesivir 12/3 >  Subjective: Oxygen saturations are stable on 2 L nasal cannula only.  Inflammatory markers are favorable.  Reports that she is feeling better today.  Denies chest pain nausea vomiting or abdominal pain.  Assessment & Plan:  COVID Pneumonia - acute on chronic hypoxic respiratory failure Continue Decadron and remdesivir -no indication for other therapies presently as patient is improving -CXR without clear infiltrate -has been weaned back to her home oxygen dose -if remains stable will consider discontinuing Decadron early tomorrow  Recent Labs  Lab 05/02/19 1440 05/03/19 0257 05/04/19 0122  DDIMER 1.11* 1.51* 1.14*  FERRITIN 66 91 127  CRP 7.6* 12.2* 9.8*  ALT 25 28 29   PROCALCITON 0.13 0.13 0.18    CKD stage IV Baseline creatinine appears to be approximately 1.8-2.0 -creatinine stable at baseline  Recent Labs  Lab 05/02/19 1440 05/03/19 0257 05/04/19 0122  CREATININE 2.01* 1.94* 2.01*    COPD Well compensated at present  HTN Blood pressure trending upward - adjust treatment and follow trend  Sinus bradycardia Heart rate is stable and patient is asymptomatic -choose medications carefully -follow trend  DM 2 with  polyneuropathy CBG reasonably controlled at present  CHF - unknown details No echocardiogram available in the system to provide further detail -net positive approximately 700 cc since admission -no gross volume overload on exam  Filed Weights   05/02/19 1448 05/04/19 0500  Weight: 68 kg 69 kg    Anxiety/depression Appears well compensated at present  Paroxysmal atrial fibrillation Extensive review of records from care everywhere suggest patient was previously on Eliquis but this was discontinued in December 2019 at the time of a GI bleed  Chronic thrombocytopenia Platelet count stable and only modestly depressed  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 rate 18, height 5\' 2"  (1.575 m), weight 69 kg, SpO2 95 %.  Intake/Output Summary (Last 24 hours) at 05/04/2019 0757 Last data filed at 05/04/2019 0300 Gross per 24 hour  Intake 1060 ml  Output 1001 ml  Net 59 ml   Filed Weights   05/02/19 1448 05/04/19 0500  Weight: 68 kg 69 kg    Examination: General: No acute respiratory distress Lungs: Fine basilar crackles with no wheezing but good air movement throughout other fields Cardiovascular: Bradycardic but regular without murmur or rub Abdomen: Nontender, nondistended, soft, bowel sounds positive, no rebound, no  ascites, no appreciable mass Extremities: No significant cyanosis, clubbing, or edema bilateral lower extremities   CBC: Recent Labs  Lab 05/02/19 1440 05/03/19 0257 05/04/19 0122  WBC 7.6 5.9 8.7  NEUTROABS  --  5.2 7.1  HGB 12.0 13.2 11.6*  HCT 37.0  40.7 35.9*  MCV 96.9 96.0 97.3  PLT 146* 148* 0000000   Basic Metabolic Panel: Recent Labs  Lab 05/02/19 1440 05/03/19 0257 05/04/19 0122  NA 137 139 142  K 4.0 4.1 4.1  CL 97* 100 98  CO2 30 28 30   GLUCOSE 120* 153* 133*  BUN 28* 33* 54*  CREATININE 2.01* 1.94* 2.01*  CALCIUM 8.2* 8.4* 8.3*  MG  --  2.1 2.3  PHOS  --  5.1*  --    GFR: Estimated Creatinine Clearance: 20.7 mL/min (A) (by C-G formula based on SCr of 2.01 mg/dL (H)).  Liver Function Tests: Recent Labs  Lab 05/02/19 1440 05/03/19 0257 05/04/19 0122  AST 30 35 36  ALT 25 28 29   ALKPHOS 60 62 52  BILITOT 0.9 0.8 0.5  PROT 6.1* 6.3* 6.1*  ALBUMIN 3.1* 3.2* 2.9*    HbA1C: Hgb A1c MFr Bld  Date/Time Value Ref Range Status  05/02/2019 04:48 PM 6.3 (H) 4.8 - 5.6 % Final    Comment:    (NOTE) Pre diabetes:          5.7%-6.4% Diabetes:              >6.4% Glycemic control for   <7.0% adults with diabetes     CBG: Recent Labs  Lab 05/03/19 0816 05/03/19 1250 05/03/19 1716 05/03/19 2141 05/04/19 0705  GLUCAP 126* 128* 173* 144* 117*    Recent Results (from the past 240 hour(s))  Blood culture (routine x 2)     Status: None (Preliminary result)   Collection Time: 05/02/19  2:28 PM   Specimen: BLOOD  Result Value Ref Range Status   Specimen Description BLOOD RIGHT ANTECUBITAL  Final   Special Requests   Final    BOTTLES DRAWN AEROBIC AND ANAEROBIC Blood Culture adequate volume   Culture   Final    NO GROWTH < 24 HOURS Performed at [facility], [address], [place], [place] [address]    Report Status PENDING  Incomplete  Blood culture (routine x 2)     Status: None (Preliminary result)   Collection Time: 05/02/19  2:30 PM   Specimen: BLOOD  Result Value Ref Range Status   Specimen Description BLOOD LEFT ANTECUBITAL  Final   Special Requests   Final    BOTTLES DRAWN AEROBIC ONLY Blood Culture results may not be optimal due to an inadequate volume of blood received in culture bottles    Culture   Final    NO GROWTH < 24 HOURS Performed at [facility], [place] [address], [place],  [address]    Report Status PENDING  Incomplete     Scheduled Meds: . amLODipine  10 mg Oral Daily  . atorvastatin  40 mg Oral Daily  . brimonidine  1 drop Both Eyes TID  . dexamethasone (DECADRON) injection  6 mg Intravenous Daily  . DULoxetine  60 mg Oral Daily  . escitalopram  5 mg Oral Daily  . famotidine  20 mg Oral QHS  . folic acid  1 mg Oral Daily  . furosemide  40 mg Oral Daily  . gabapentin  100 mg Oral BID  . heparin injection (subcutaneous)  5,000 Units  Subcutaneous Q8H  . insulin aspart  0-5 Units Subcutaneous QHS  . insulin aspart  0-9 Units Subcutaneous TID WC  . Ipratropium-Albuterol  1 puff Inhalation Q6H  . labetalol  100 mg Oral BID  . latanoprost  1 drop Both Eyes QHS  . Muscle Rub   Topical BID  . pantoprazole  40 mg Oral Daily  . polyvinyl alcohol  1 drop Both Eyes BID  . sodium chloride flush  3 mL Intravenous Q12H  . vitamin C  500 mg Oral Daily  . zinc sulfate  220 mg Oral Daily     LOS: 2 days   Cherene Altes, MD [facility] Office  [phone] Pager - Text Page per Amion  If 7PM-7AM, please contact night-coverage per Amion 05/04/2019, 7:57 AM

## 2019-05-05 DIAGNOSIS — U071 COVID-19: Secondary | ICD-10-CM | POA: Diagnosis not present

## 2019-05-05 LAB — CBC WITH DIFFERENTIAL/PLATELET
Abs Immature Granulocytes: 0.04 10*3/uL (ref 0.00–0.07)
Basophils Absolute: 0 10*3/uL (ref 0.0–0.1)
Basophils Relative: 0 %
Eosinophils Absolute: 0 10*3/uL (ref 0.0–0.5)
Eosinophils Relative: 0 %
HCT: 35.3 % — ABNORMAL LOW (ref 36.0–46.0)
Hemoglobin: 11.4 g/dL — ABNORMAL LOW (ref 12.0–15.0)
Immature Granulocytes: 0 %
Lymphocytes Relative: 5 %
Lymphs Abs: 0.5 10*3/uL — ABNORMAL LOW (ref 0.7–4.0)
MCH: 31.1 pg (ref 26.0–34.0)
MCHC: 32.3 g/dL (ref 30.0–36.0)
MCV: 96.4 fL (ref 80.0–100.0)
Monocytes Absolute: 1.1 10*3/uL — ABNORMAL HIGH (ref 0.1–1.0)
Monocytes Relative: 11 %
Neutro Abs: 8.3 10*3/uL — ABNORMAL HIGH (ref 1.7–7.7)
Neutrophils Relative %: 84 %
Platelets: 173 10*3/uL (ref 150–400)
RBC: 3.66 MIL/uL — ABNORMAL LOW (ref 3.87–5.11)
RDW: 17.1 % — ABNORMAL HIGH (ref 11.5–15.5)
WBC: 9.9 10*3/uL (ref 4.0–10.5)
nRBC: 0 % (ref 0.0–0.2)

## 2019-05-05 LAB — COMPREHENSIVE METABOLIC PANEL
ALT: 31 U/L (ref 0–44)
AST: 38 U/L (ref 15–41)
Albumin: 3 g/dL — ABNORMAL LOW (ref 3.5–5.0)
Alkaline Phosphatase: 50 U/L (ref 38–126)
Anion gap: 13 (ref 5–15)
BUN: 60 mg/dL — ABNORMAL HIGH (ref 8–23)
CO2: 29 mmol/L (ref 22–32)
Calcium: 8.2 mg/dL — ABNORMAL LOW (ref 8.9–10.3)
Chloride: 98 mmol/L (ref 98–111)
Creatinine, Ser: 2 mg/dL — ABNORMAL HIGH (ref 0.44–1.00)
GFR calc Af Amer: 27 mL/min — ABNORMAL LOW (ref >60)
GFR calc non Af Amer: 23 mL/min — ABNORMAL LOW (ref >60)
Glucose, Bld: 131 mg/dL — ABNORMAL HIGH (ref 70–99)
Potassium: 4.3 mmol/L (ref 3.5–5.1)
Sodium: 140 mmol/L (ref 135–145)
Total Bilirubin: 0.7 mg/dL (ref 0.3–1.2)
Total Protein: 6 g/dL — ABNORMAL LOW (ref 6.5–8.1)

## 2019-05-05 LAB — GLUCOSE, CAPILLARY
Glucose-Capillary: 116 mg/dL — ABNORMAL HIGH (ref 70–99)
Glucose-Capillary: 118 mg/dL — ABNORMAL HIGH (ref 70–99)
Glucose-Capillary: 125 mg/dL — ABNORMAL HIGH (ref 70–99)
Glucose-Capillary: 142 mg/dL — ABNORMAL HIGH (ref 70–99)
Glucose-Capillary: 142 mg/dL — ABNORMAL HIGH (ref 70–99)
Glucose-Capillary: 175 mg/dL — ABNORMAL HIGH (ref 70–99)

## 2019-05-05 LAB — D-DIMER, QUANTITATIVE: D-Dimer, Quant: 0.96 ug/mL-FEU — ABNORMAL HIGH (ref 0.00–0.50)

## 2019-05-05 LAB — FERRITIN: Ferritin: 132 ng/mL (ref 11–307)

## 2019-05-05 LAB — C-REACTIVE PROTEIN: CRP: 6.4 mg/dL — ABNORMAL HIGH (ref <1.0)

## 2019-05-05 LAB — MAGNESIUM: Magnesium: 2.1 mg/dL (ref 1.7–2.4)

## 2019-05-05 MED ORDER — DEXAMETHASONE 6 MG PO TABS
6.0000 mg | ORAL_TABLET | Freq: Every day | ORAL | Status: DC
  Administered 2019-05-06: 6 mg via ORAL
  Filled 2019-05-05 (×2): qty 1

## 2019-05-05 NOTE — Progress Notes (Signed)
Joyce Ortiz  [id number] DOB: 11-Apr-1941 DOA: 05/02/2019 PCP: Bonnita Nasuti, MD    Brief Narrative:  78 year old resident of a long term care facility with a history of COPD, chronic hypoxic respiratory failure requiring 2 L nasal cannula, paroxysmal atrial fibrillation, DM 2 with neuropathy, HTN, CHF, Thrombotic CVA, limited ambulatory status, and GERD who presented to the ED with a 2-week history of productive cough and shortness of breath.  She noted her oxygen saturation dropping into the 80s at her assisted living facility.  In the ED she was found to be Covid positive and required 4 L of nasal cannula support.  A CXR noted bibasilar atelectasis.  Significant Events: 12/3 admit via [facility] - positive Covid test 12/4 transfer to [facility]  COVID-19 specific Treatment: Decadron 12/3 >  Remdesivir 12/3 > 12/7  Subjective: Resting comfortably in bed.  No new complaints.  Alert and pleasant.  Assessment & Plan:  COVID Pneumonia - acute on chronic hypoxic respiratory failure Continue Decadron and remdesivir - no indication for other therapies presently as patient is improving - CXR without clear infiltrate but w/ chronic appearing interstitial markings - has been weaned back to her home oxygen dose  Recent Labs  Lab 05/02/19 1440 05/03/19 0257 05/04/19 0122 05/05/19 0111  DDIMER 1.11* 1.51* 1.14* 0.96*  FERRITIN 66 91 127 132  CRP 7.6* 12.2* 9.8* 6.4*  ALT 25 28 29 31   PROCALCITON 0.13 0.13 0.18  --     CKD stage IV Baseline creatinine appears to be approximately 1.8-2.0 -creatinine stable at baseline  Recent Labs  Lab 05/02/19 1440 05/03/19 0257 05/04/19 0122 05/05/19 0111  CREATININE 2.01* 1.94* 2.01* 2.00*    COPD Well compensated   HTN Blood pressure controlled at this time  Sinus bradycardia Heart rate is stable and patient is asymptomatic -choose medications carefully -follow trend  DM 2 with polyneuropathy CBG reasonably controlled   CHF  - unknown details No echocardiogram available in the system to provide further detail -net positive approximately 1500 cc since admission - no gross volume overload on exam  Filed Weights   05/02/19 1448 05/04/19 0500 05/05/19 0500  Weight: 68 kg 69 kg 67.8 kg    Anxiety/depression well compensated at present  Paroxysmal atrial fibrillation Extensive review of records from care everywhere suggest patient was previously on Eliquis but this was discontinued in December 2019 at the time of a GI bleed -sinus bradycardia at present  Chronic thrombocytopenia Platelet count stable and only modestly depressed  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 rate 14, height 5\' 2"  (1.575 m), weight 67.8 kg, SpO2 96 %.  Intake/Output Summary (Last 24 hours) at 05/05/2019 0718 Last data filed at 05/05/2019 0624 Gross per 24 hour  Intake 828.11 ml  Output 1 ml  Net 827.11 ml   Filed Weights   05/02/19 1448 05/04/19 0500 05/05/19 0500  Weight: 68 kg 69 kg 67.8  kg    Examination: General: No acute respiratory distress -alert and pleasant Lungs: Clear to auscultation throughout with no wheezing Cardiovascular: Bradycardic but regular  Abdomen: Nontender, nondistended, soft, bowel sounds positive Extremities: No signif edema B LE    CBC: Recent Labs  Lab 05/03/19 0257 05/04/19 0122 05/05/19 0111  WBC 5.9 8.7 9.9  NEUTROABS  5.2 7.1 8.3*  HGB 13.2 11.6* 11.4*  HCT 40.7 35.9* 35.3*  MCV 96.0 97.3 96.4  PLT 148* 162 [id number]   Basic Metabolic Panel: Recent Labs  Lab 05/03/19 0257 05/04/19 0122 05/05/19 0111  NA 139 142 140  K 4.1 4.1 4.3  CL 100 98 98  CO2 28 30 29   GLUCOSE 153* 133* 131*  BUN 33* 54* 60*  CREATININE 1.94* 2.01* 2.00*  CALCIUM 8.4* 8.3* 8.2*  MG 2.1 2.3 2.1  PHOS 5.1*  --   --    GFR: Estimated Creatinine Clearance: 20.6 mL/min (A) (by C-G formula based on SCr of 2 mg/dL (H)).  Liver Function Tests: Recent Labs  Lab 05/02/19 1440 05/03/19 0257 05/04/19 0122 05/05/19 0111  AST 30 35 36 38  ALT 25 28 29 31   ALKPHOS 60 62 52 50  BILITOT 0.9 0.8 0.5 0.7  PROT 6.1* 6.3* 6.1* 6.0*  ALBUMIN 3.1* 3.2* 2.9* 3.0*    HbA1C: Hgb A1c MFr Bld  Date/Time Value Ref Range Status  05/02/2019 04:48 PM 6.3 (H) 4.8 - 5.6 % Final    Comment:    (NOTE) Pre diabetes:          5.7%-6.4% Diabetes:              >6.4% Glycemic control for   <7.0% adults with diabetes     CBG: Recent Labs  Lab 05/04/19 1125 05/04/19 1627 05/04/19 2015 05/05/19 0001 05/05/19 0347  GLUCAP 122* 147* 158* 142* 142*    Recent Results (from the past 240 hour(s))  Blood culture (routine x 2)     Status: None (Preliminary result)   Collection Time: 05/02/19  2:28 PM   Specimen: BLOOD  Result Value Ref Range Status   Specimen Description BLOOD RIGHT ANTECUBITAL  Final   Special Requests   Final    BOTTLES DRAWN AEROBIC AND ANAEROBIC Blood Culture adequate volume   Culture   Final    NO GROWTH 2 DAYS Performed at [facility], [place] [address] [place], [place] [address]    Report Status PENDING  Incomplete  Blood culture (routine x 2)     Status: None (Preliminary result)   Collection Time: 05/02/19  2:30 PM   Specimen: BLOOD  Result Value Ref Range Status   Specimen Description BLOOD LEFT ANTECUBITAL  Final   Special Requests   Final    BOTTLES DRAWN AEROBIC ONLY Blood Culture results may not  be optimal due to an inadequate volume of blood received in culture bottles   Culture  Setup Time   Final    GRAM POSITIVE COCCI IN CLUSTERS AEROBIC BOTTLE ONLY CRITICAL RESULT CALLED TO, READ BACK BY AND VERIFIED WITH: T. MEYER,PHARMD 2311 05/04/2019 T. TYSOR Performed at [facility], [place] [address], [place], [place] [address]    Culture GRAM POSITIVE COCCI  Final   Report Status PENDING  Incomplete     Scheduled Meds: . amLODipine  10 mg Oral Daily  . atorvastatin  40 mg Oral Daily  . brimonidine  1 drop Both Eyes TID  . dexamethasone (DECADRON) injection  6 mg  Intravenous Daily  . DULoxetine  60 mg Oral Daily  . escitalopram  5 mg Oral Daily  . famotidine  20 mg Oral QHS  . folic acid  1 mg Oral Daily  . furosemide  40 mg Oral Daily  . gabapentin  100 mg Oral BID  . heparin injection (subcutaneous)  5,000 Units Subcutaneous Q8H  . insulin aspart  0-5 Units Subcutaneous QHS  . insulin aspart  0-9 Units Subcutaneous TID WC  . Ipratropium-Albuterol  1 puff Inhalation Q6H  . labetalol  50 mg Oral BID  . latanoprost  1 drop Both Eyes QHS  . Muscle Rub   Topical BID  . pantoprazole  40 mg Oral Daily  . polyvinyl alcohol  1 drop Both Eyes BID  . sodium chloride flush  3 mL Intravenous Q12H  . vitamin C  500 mg Oral Daily  . zinc sulfate  220 mg Oral Daily     LOS: 3 days   Cherene Altes, MD [facility] Office  [phone] Pager - Text Page per Amion  If 7PM-7AM, please contact night-coverage per Amion 05/05/2019, 7:18 AM

## 2019-05-06 DIAGNOSIS — U071 COVID-19: Secondary | ICD-10-CM | POA: Diagnosis not present

## 2019-05-06 LAB — GLUCOSE, CAPILLARY
Glucose-Capillary: 125 mg/dL — ABNORMAL HIGH (ref 70–99)
Glucose-Capillary: 114 mg/dL — ABNORMAL HIGH (ref 70–99)
Glucose-Capillary: 192 mg/dL — ABNORMAL HIGH (ref 70–99)

## 2019-05-06 LAB — CULTURE, BLOOD (ROUTINE X 2)

## 2019-05-06 NOTE — NC FL2 (Signed)
[place] LEVEL OF CARE SCREENING TOOL     IDENTIFICATION  Patient Name: Joyce Ortiz Birthdate: 12-Nov-1940 Sex: female Admission Date (Current Location): 05/02/2019  [facility] and [place] Number:  Herbalist and Address:  The . [facility], [place] [address] [place],  [address]      Provider Number: [id number]  Attending Physician Name and Address:  Cherene Altes, MD  Relative Name and Phone Number:       Current Level of Care: Hospital Recommended Level of Care: [place] Prior Approval Number:    Date Approved/Denied:   PASRR Number:    Discharge Plan: Other (Comment)(ALF)    Current Diagnoses: Patient Active Problem List   Diagnosis Date Noted  . Pneumonia due to COVID-19 virus 05/03/2019  . COVID-19 virus infection 05/02/2019  . Acute on chronic respiratory failure with hypoxia ([place]) 05/02/2019  . DNR (do not resuscitate) 05/02/2019  . GERD (gastroesophageal reflux disease) 05/02/2019  . Thrombocytopenia ([place]) 05/02/2019  . CKD (chronic kidney disease), stage IV ([place]) 05/02/2019  . History of CHF (congestive heart failure) 05/02/2019  . Type 2 diabetes mellitus with polyneuropathy ([place]) 05/02/2019  . Essential hypertension 05/02/2019    Orientation RESPIRATION BLADDER Height & Weight     Place, Self  O2(2L) Continent Weight: 149 lb 7.6 oz (67.8 kg) Height:  5\' 2"  (157.5 cm)  BEHAVIORAL SYMPTOMS/MOOD NEUROLOGICAL BOWEL NUTRITION STATUS      Continent Diet(regular)  AMBULATORY STATUS COMMUNICATION OF NEEDS Skin   Extensive Assist Verbally Normal                       Personal Care Assistance Level of Assistance  Bathing, Dressing, Feeding Bathing Assistance: Limited assistance Feeding assistance: Limited assistance Dressing Assistance: Limited assistance     Functional Limitations Info             SPECIAL CARE FACTORS FREQUENCY  PT (By licensed PT), OT (By licensed OT)      PT Frequency: Home Health OT Frequency: Home Health            Contractures      Additional Factors Info  Code Status, Allergies Code Status Info: DNR Allergies Info: Aspirin Biaxin (Clarithromycin) Codeine Moxifloxacin           Current Medications (05/06/2019):  This is the current hospital active medication list Current Facility-Administered Medications  Medication Dose Route Frequency Provider Last Rate Last Dose  . acetaminophen (TYLENOL) tablet 650 mg  650 mg Oral Q6H PRN Fuller Plan A, MD   650 mg at 05/05/19 2123  . amLODipine (NORVASC) tablet 10 mg  10 mg Oral Daily Tamala Julian, Rondell A, MD   10 mg at 05/06/19 1029  . atorvastatin (LIPITOR) tablet 40 mg  40 mg Oral Daily Tamala Julian, Rondell A, MD   40 mg at 05/06/19 1031  . bisacodyl (DULCOLAX) EC tablet 5 mg  5 mg Oral Daily PRN Tamala Julian, Rondell A, MD      . brimonidine (ALPHAGAN) 0.15 % ophthalmic solution 1 drop  1 drop Both Eyes TID Fuller Plan A, MD   1 drop at 05/06/19 1014  . dexamethasone (DECADRON) tablet 6 mg  6 mg Oral Daily Cherene Altes, MD   6 mg at 05/06/19 1221  . DULoxetine (CYMBALTA) DR capsule 60 mg  60 mg Oral Daily Tamala Julian, Rondell A, MD   60 mg at 05/06/19 1032  . escitalopram (LEXAPRO) tablet 5 mg  5 mg Oral Daily Smith, Rondell A, MD   5 mg at 05/06/19 1030  . famotidine (PEPCID) tablet 20 mg  20 mg Oral QHS Cherene Altes, MD   20 mg at 05/05/19 2123  . folic acid (FOLVITE) tablet 1 mg  1 mg Oral Daily Smith, Rondell A, MD   1 mg at 05/06/19 1221  . furosemide (LASIX) tablet 40 mg  40 mg Oral Daily Smith, Rondell A, MD   40 mg at 05/06/19 1029  . gabapentin (NEURONTIN) capsule 100 mg  100 mg Oral BID Fuller Plan A, MD   100 mg at 05/06/19 1030  . guaiFENesin-dextromethorphan (ROBITUSSIN DM) 100-10 MG/5ML syrup 10 mL  10 mL Oral Q4H PRN Fuller Plan A, MD   10 mL at 05/02/19 1657  . heparin injection 5,000 Units  5,000 Units Subcutaneous Q8H Smith, Rondell A, MD   5,000 Units at 05/06/19  0600  . insulin aspart (novoLOG) injection 0-5 Units  0-5 Units Subcutaneous QHS Smith, Rondell A, MD      . insulin aspart (novoLOG) injection 0-9 Units  0-9 Units Subcutaneous TID WC Norval Morton, MD   1 Units at 05/06/19 [phone]  . Ipratropium-Albuterol (COMBIVENT) respimat 1 puff  1 puff Inhalation Q6H Fuller Plan A, MD   1 puff at 05/06/19 0846  . labetalol (NORMODYNE) tablet 50 mg  50 mg Oral BID Cherene Altes, MD   50 mg at 05/06/19 1032  . latanoprost (XALATAN) 0.005 % ophthalmic solution 1 drop  1 drop Both Eyes QHS Fuller Plan A, MD   1 drop at 05/05/19 2129  . Muscle Rub CREA   Topical BID Fuller Plan A, MD      . ondansetron (ZOFRAN) tablet 4 mg  4 mg Oral Q6H PRN Fuller Plan A, MD       Or  . ondansetron (ZOFRAN) injection 4 mg  4 mg Intravenous Q6H PRN Smith, Rondell A, MD      . oxyCODONE (Oxy IR/ROXICODONE) immediate release tablet 10 mg  10 mg Oral Q6H PRN Fuller Plan A, MD   10 mg at 05/06/19 1206  . pantoprazole (PROTONIX) EC tablet 40 mg  40 mg Oral Daily Tamala Julian, Rondell A, MD   40 mg at 05/06/19 1031  . polyvinyl alcohol (LIQUIFILM TEARS) 1.4 % ophthalmic solution 1 drop  1 drop Both Eyes BID Fuller Plan A, MD   1 drop at 05/06/19 1015  . sodium chloride flush (NS) 0.9 % injection 3 mL  3 mL Intravenous Q12H Smith, Rondell A, MD   3 mL at 05/06/19 1013  . vitamin C (ASCORBIC ACID) tablet 500 mg  500 mg Oral Daily Smith, Rondell A, MD   500 mg at 05/06/19 1031  . zinc sulfate capsule 220 mg  220 mg Oral Daily Fuller Plan A, MD   220 mg at 05/06/19 1104     Discharge Medications: Please see discharge summary for a list of discharge medications.  Relevant Imaging Results:  Relevant Lab Results:   Additional [place], LCSW

## 2019-05-06 NOTE — Progress Notes (Signed)
Joyce Ortiz  [id number] DOB: August 29, 1940 DOA: 05/02/2019 PCP: Bonnita Nasuti, MD    Brief Narrative:  78 year old resident of a long term care facility with a history of COPD, chronic hypoxic respiratory failure requiring 2 L nasal cannula, paroxysmal atrial fibrillation, DM 2 with neuropathy, HTN, CHF, Thrombotic CVA, limited ambulatory status, and GERD who presented to the ED with a 2-week history of productive cough and shortness of breath.  She noted her oxygen saturation dropping into the 80s at her assisted living facility.  In the ED she was found to be Covid positive and required 4 L of nasal cannula support.  A CXR noted bibasilar atelectasis.  Significant Events: 12/3 admit via [facility] - positive Covid test 12/4 transfer to [facility]  COVID-19 specific Treatment: Decadron 12/3 > 12/7 Remdesivir 12/3 > 12/7  Subjective: Reports significant nausea today.  Continues to have difficulty with generalized weakness and poor ambulatory ability.  Denies shortness of breath.  Appears uncomfortable and tired.  Assessment & Plan:  COVID Pneumonia - acute on chronic hypoxic respiratory failure Completes her course of remdesivir today -stopping steroid at this time as patient is developing probable symptoms of uremia and has been weaned back to her home oxygen therapy - no indication for other therapies presently as patient is improving from a respiratory standpoint- CXR without clear infiltrate but w/ chronic appearing interstitial markings - has been weaned back to her home oxygen dose  Recent Labs  Lab 05/02/19 1440 05/03/19 0257 05/04/19 0122 05/05/19 0111  DDIMER 1.11* 1.51* 1.14* 0.96*  FERRITIN 66 91 127 132  CRP 7.6* 12.2* 9.8* 6.4*  ALT 25 28 29 31   PROCALCITON 0.13 0.13 0.18  --     CKD stage IV Baseline creatinine appears to be approximately 1.8-2.0 -creatinine stable at baseline  Recent Labs  Lab 05/02/19 1440 05/03/19 0257 05/04/19 0122 05/05/19 0111   CREATININE 2.01* 1.94* 2.01* 2.00*    COPD Well compensated   HTN Follow without change in treatment today  Sinus bradycardia Heart rate is stable and patient is asymptomatic -choose medications carefully -follow trend  DM 2 with polyneuropathy CBG reasonably controlled -no change in treatment today  CHF - unknown details No echocardiogram available in the system to provide further detail -net positive approximately 2000 cc since admission - no gross volume overload on exam  Filed Weights   05/02/19 1448 05/04/19 0500 05/05/19 0500  Weight: 68 kg 69 kg 67.8 kg    Anxiety/depression well compensated at present  Paroxysmal atrial fibrillation Extensive review of records from care everywhere suggest patient was previously on Eliquis but this was discontinued in December 2019 at the time of a GI bleed -sinus bradycardia at present  Chronic thrombocytopenia Platelet count stable and only modestly depressed  Chronic debility and dysphagia 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 rate 18, height 5\' 2"  (1.575 m), weight 67.8 kg, SpO2 94 %.  Intake/Output Summary (Last 24 hours) at 05/06/2019 [phone]  Last data filed at 05/06/2019 0800 Gross per 24 hour  Intake 542.54 ml  Output 0 ml  Net 542.54 ml   Filed Weights   05/02/19 1448 05/04/19 0500 05/05/19 0500  Weight: 68 kg 69 kg 67.8 kg    Examination: General: No acute respiratory distress Lungs: Clear  to auscultation without wheezing Cardiovascular: Bradycardic but regular  Abdomen: Nontender, nondistended, soft, bowel sounds hypoactive Extremities: No signif edema B LE    CBC: Recent Labs  Lab 05/03/19 0257 05/04/19 0122 05/05/19 0111  WBC 5.9 8.7 9.9  NEUTROABS 5.2 7.1 8.3*  HGB 13.2 11.6* 11.4*  HCT 40.7 35.9* 35.3*  MCV 96.0 97.3 96.4  PLT 148* 162 [id number]   Basic Metabolic Panel: Recent Labs  Lab 05/03/19 0257 05/04/19 0122 05/05/19 0111  NA 139 142 140  K 4.1 4.1 4.3  CL 100 98 98  CO2 28 30 29   GLUCOSE 153* 133* 131*  BUN 33* 54* 60*  CREATININE 1.94* 2.01* 2.00*  CALCIUM 8.4* 8.3* 8.2*  MG 2.1 2.3 2.1  PHOS 5.1*  --   --    GFR: Estimated Creatinine Clearance: 20.6 mL/min (A) (by C-G formula based on SCr of 2 mg/dL (H)).  Liver Function Tests: Recent Labs  Lab 05/02/19 1440 05/03/19 0257 05/04/19 0122 05/05/19 0111  AST 30 35 36 38  ALT 25 28 29 31   ALKPHOS 60 62 52 50  BILITOT 0.9 0.8 0.5 0.7  PROT 6.1* 6.3* 6.1* 6.0*  ALBUMIN 3.1* 3.2* 2.9* 3.0*    HbA1C: Hgb A1c MFr Bld  Date/Time Value Ref Range Status  05/02/2019 04:48 PM 6.3 (H) 4.8 - 5.6 % Final    Comment:    (NOTE) Pre diabetes:          5.7%-6.4% Diabetes:              >6.4% Glycemic control for   <7.0% adults with diabetes     CBG: Recent Labs  Lab 05/05/19 0809 05/05/19 1113 05/05/19 1701 05/05/19 2052 05/06/19 0741  GLUCAP 118* 116* 125* 175* 125*    Recent Results (from the past 240 hour(s))  Blood culture (routine x 2)     Status: None (Preliminary result)   Collection Time: 05/02/19  2:28 PM   Specimen: BLOOD  Result Value Ref Range Status   Specimen Description BLOOD RIGHT ANTECUBITAL  Final   Special Requests   Final    BOTTLES DRAWN AEROBIC AND ANAEROBIC Blood Culture adequate volume   Culture   Final    NO GROWTH 3 DAYS Performed at [facility], [address], [place], [place] [address]    Report Status PENDING  Incomplete  Blood culture  (routine x 2)     Status: None (Preliminary result)   Collection Time: 05/02/19  2:30 PM   Specimen: BLOOD  Result Value Ref Range Status   Specimen Description BLOOD LEFT ANTECUBITAL  Final   Special Requests   Final    BOTTLES DRAWN AEROBIC ONLY Blood Culture results may not be optimal due to an inadequate volume of blood received in culture bottles   Culture  Setup Time   Final    GRAM POSITIVE COCCI IN CLUSTERS AEROBIC BOTTLE ONLY CRITICAL RESULT CALLED TO, READ BACK BY AND VERIFIED WITH: T. MEYER,PHARMD 2311 05/04/2019 T. TYSOR    Culture   Final    GRAM POSITIVE COCCI IDENTIFICATION TO FOLLOW Performed at [facility], [place] [address] [place], [place] [address]  Report Status PENDING  Incomplete     Scheduled Meds: . amLODipine  10 mg Oral Daily  . atorvastatin  40 mg Oral Daily  . brimonidine  1 drop Both Eyes TID  . dexamethasone  6 mg Oral Daily  . DULoxetine  60 mg Oral Daily  . escitalopram  5 mg Oral Daily  . famotidine  20 mg Oral QHS  . folic acid  1 mg Oral Daily  . furosemide  40 mg Oral Daily  . gabapentin  100 mg Oral BID  . heparin injection (subcutaneous)  5,000 Units Subcutaneous Q8H  . insulin aspart  0-5 Units Subcutaneous QHS  . insulin aspart  0-9 Units Subcutaneous TID WC  . Ipratropium-Albuterol  1 puff Inhalation Q6H  . labetalol  50 mg Oral BID  . latanoprost  1 drop Both Eyes QHS  . Muscle Rub   Topical BID  . pantoprazole  40 mg Oral Daily  . polyvinyl alcohol  1 drop Both Eyes BID  . sodium chloride flush  3 mL Intravenous Q12H  . vitamin C  500 mg Oral Daily  . zinc sulfate  220 mg Oral Daily     LOS: 4 days   Cherene Altes, MD [facility] Office  [phone] Pager - Text Page per Amion  If 7PM-7AM, please contact night-coverage per Amion 05/06/2019, 9:47 AM

## 2019-05-06 NOTE — TOC Initial Note (Signed)
Transition of Care [facility]) - Initial/Assessment Note    Patient Details  Name: Joyce Ortiz MRN: [id number] Date of Birth: 22-Oct-1940  Transition of Care [facility]) CM/SW Contact:    Weston Anna, LCSW Phone Number: 05/06/2019, 3:32 PM  Clinical Narrative:                  Patient is a long-term resident at [facility] in [place]- patient is able to return once medically stable. Will just need updated DC summary/ FL2.   Expected Discharge Plan: Assisted Living Barriers to Discharge: Continued Medical Work up   Patient Goals and CMS Choice        Expected Discharge Plan and Services Expected Discharge Plan: Assisted Living         Expected Discharge Date: 05/06/19                                    Prior Living Arrangements/Services                       Activities of Daily Living Home Assistive Devices/Equipment: Wheelchair ADL Screening (condition at time of admission) Patient's cognitive ability adequate to safely complete daily activities?: No Is the patient deaf or have difficulty hearing?: No Does the patient have difficulty seeing, even when wearing glasses/contacts?: No Does the patient have difficulty concentrating, remembering, or making decisions?: Yes Patient able to express need for assistance with ADLs?: No Does the patient have difficulty dressing or bathing?: Yes Independently performs ADLs?: No Communication: Independent Dressing (OT): Needs assistance Is this a change from baseline?: Pre-admission baseline Grooming: Needs assistance Is this a change from baseline?: Pre-admission baseline Feeding: Independent Bathing: Needs assistance Is this a change from baseline?: Pre-admission baseline Toileting: Needs assistance Is this a change from baseline?: Pre-admission baseline In/Out Bed: Needs assistance Is this a change from baseline?: Pre-admission baseline Walks in Home: Dependent Is this a change from baseline?:  Pre-admission baseline Does the patient have difficulty walking or climbing stairs?: Yes Weakness of Legs: Both Weakness of Arms/Hands: Both  Permission Sought/Granted                  Emotional Assessment           Psych Involvement: No (comment)  Admission diagnosis:  Hypoxia [R09.02] COVID-19 [U07.1] Pneumonia due to COVID-19 virus [U07.1, J12.89] Patient Active Problem List   Diagnosis Date Noted  . Pneumonia due to COVID-19 virus 05/03/2019  . COVID-19 virus infection 05/02/2019  . Acute on chronic respiratory failure with hypoxia ([place]) 05/02/2019  . DNR (do not resuscitate) 05/02/2019  . GERD (gastroesophageal reflux disease) 05/02/2019  . Thrombocytopenia ([place]) 05/02/2019  . CKD (chronic kidney disease), stage IV ([place]) 05/02/2019  . History of CHF (congestive heart failure) 05/02/2019  . Type 2 diabetes mellitus with polyneuropathy ([place]) 05/02/2019  . Essential hypertension 05/02/2019   PCP:  Bonnita Nasuti, MD Pharmacy:  No Pharmacies Listed    Social Determinants of Health (SDOH) Interventions    Readmission Risk Interventions No flowsheet data found.

## 2019-05-06 NOTE — Plan of Care (Signed)
  Problem: Education: Goal: Knowledge of risk factors and measures for prevention of condition will improve Outcome: Progressing   Problem: Respiratory: Goal: Will maintain a patent airway Outcome: Progressing   

## 2019-05-06 NOTE — Evaluation (Signed)
Physical Therapy Evaluation Patient Details Name: Joyce Ortiz MRN: [id number] DOB: 1940-07-23 Today's Date: 05/06/2019   History of Present Illness  Joyce Ortiz is a 78 y.o. female with medical history significant of COPD, oxygen dependent on 2 L nasal cannula oxygen at baseline, paroxysmal atrial fibrillation, DM type 2 with neuropathy, hypertension, CHF, CVA with residual right weakness, and GERD.  She presents with complaints of 2-week history of productive cough and shortness of breath wheezing and was very congested and  O2 saturations were noted to be in the 80s on her normal 2 L. Patient is from ALF, Positive for Covid-19.  Clinical Impression  THE PATIENT DID PARTICIPATE IN MOBIiZING TO the BSC,then to a recliner, able to scoot self onto tall seat and back into seat. The patient reports that she is assisted into [facility] and mobilizes from [facility] level at her ALF. Patient  Most likely near her baseline , requiring assistance for all ADL's and mobility.  Pt admitted with above diagnosis. Pt currently with functional limitations due to the deficits listed below (see PT Problem List). Pt will benefit from skilled PT to increase their independence and safety with mobility to allow discharge to the venue listed below.       Follow Up Recommendations Home health PT(return to ALF, may not require PT, near baseline)    Equipment Recommendations  None recommended by PT    Recommendations for Other Services       Precautions / Restrictions Precautions Precautions: Fall Restrictions Weight Bearing Restrictions: (P) No      Mobility  Bed Mobility Overal bed mobility: Needs Assistance Bed Mobility: Supine to Sit     Supine to sit: Min guard     General bed mobility comments: patient moved self to sitting on bed edge without extra assistance  Transfers Overall transfer level: Needs assistance Equipment used: 1 person hand held assist Transfers: Sit to/from [employer] Sit to Stand: Min assist Stand pivot transfers: Min assist       General transfer comment: steady assist to rise from bed and BSC with shelf arm support on 1 UE,  transfer to [facility] then to recliner.  Ambulation/Gait                Stairs            Wheelchair Mobility    Modified Rankin (Stroke Patients Only)       Balance Overall balance assessment: Needs assistance Sitting-balance support: Feet supported;No upper extremity supported Sitting balance-Leahy Scale: Fair Sitting balance - Comments: dons socks sitting   Standing balance support: During functional activity;Single extremity supported Standing balance-Leahy Scale: Fair Standing balance comment: stands and pulls uop depends with steady min assistance                             Pertinent Vitals/Pain Pain Assessment: Faces Pain Score: 5  Faces Pain Scale: Hurts even more Pain Location: feet Pain Descriptors / Indicators: Discomfort Pain Intervention(s): Monitored during session;Limited activity within patient's tolerance    Home Living Family/patient expects to be discharged to:: Assisted living               Home Equipment: Walker - 4 wheels;Bedside commode;Shower seat;Wheelchair - manual      Prior Function Level of Independence: Needs assistance   Gait / Transfers Assistance Needed: pt. reports does not AMB. they won't let me" Gets around in [facility], ? if patient  self propels  ADL's / Homemaking Assistance Needed: requires assisance for toileting        Hand Dominance   Dominant Hand: Right    Extremity/Trunk Assessment   Upper Extremity Assessment Upper Extremity Assessment: (P) LUE deficits/detail;RUE deficits/detail RUE Deficits / Details: (P) Previous CVA with residual deficits. Shld flex ~20 degrees. Tone noted in right hand. LUE Deficits / Details: (P) Shld flex ~45 degrees.     Lower Extremity Assessment Lower Extremity Assessment: RLE  deficits/detail;LLE deficits/detail RLE Deficits / Details: able  to figure 4 cross legs to don sock, noted extraneous movement of the leg and feet, noted  when in supine LLE Deficits / Details: same as right    Cervical / Trunk Assessment Cervical / Trunk Assessment: Normal  Communication   Communication: No difficulties(Speech is very quiet)  Cognition Arousal/Alertness: Awake/alert Behavior During Therapy: WFL for tasks assessed/performed Overall Cognitive Status: No family/caregiver present to determine baseline cognitive functioning                                 General Comments: generally oriented to situation, follows directions of simple  commands      General Comments General comments (skin integrity, edema, etc.): (P) Pt on 2L [place] with O2 SATs maintaining in the 90s throughout.    Exercises     Assessment/Plan    PT Assessment Patient needs continued PT services  PT Problem List Decreased strength;Decreased mobility;Decreased safety awareness;Decreased knowledge of precautions;Decreased activity tolerance;Decreased balance;Decreased knowledge of use of DME       PT Treatment Interventions Therapeutic activities;Therapeutic exercise;Patient/family education;Functional mobility training;Wheelchair mobility training    PT Goals (Current goals can be found in the Care Plan section)  Acute Rehab PT Goals Patient Stated Goal: agreed to getting to recliner PT Goal Formulation: With patient Time For Goal Achievement: 05/20/19 Potential to Achieve Goals: Fair    Frequency Min 2X/week   Barriers to discharge        Co-evaluation PT/OT/SLP Co-Evaluation/Treatment: Yes Reason for Co-Treatment: For patient/therapist safety PT goals addressed during session: Mobility/safety with mobility OT goals addressed during session: (P) ADL's and self-care       AM-PAC PT "6 Clicks" Mobility  Outcome Measure Help needed turning from your back to your side while  in a flat bed without using bedrails?: None Help needed moving from lying on your back to sitting on the side of a flat bed without using bedrails?: None Help needed moving to and from a bed to a chair (including a wheelchair)?: A Little Help needed standing up from a chair using your arms (e.g., wheelchair or bedside chair)?: A Little Help needed to walk in hospital room?: Total Help needed climbing 3-5 steps with a railing? : Total 6 Click Score: 16    End of Session Equipment Utilized During Treatment: Gait belt Activity Tolerance: Patient tolerated treatment well Patient left: in chair;with call bell/phone within reach;with chair alarm set Nurse Communication: Mobility status PT Visit Diagnosis: Unsteadiness on feet (R26.81)    TimeSH:[id number] PT Time Calculation (min) (ACUTE ONLY): 33 min   Charges:   PT Evaluation $PT Eval Low Complexity: 1 Low          [place]  Office [phone]   Claretha Cooper 05/06/2019, 10:07 AM

## 2019-05-06 NOTE — Progress Notes (Signed)
Occupational Therapy Evaluation  Clinical Impression: PTA pt lived at an ALF, requiring assist for all self-care and functional transfer tasks. Pt required mod encouragement and education on importance of daily activity and participating in therapy this date. Pt agreeable to transferring to/from [facility] and to the bedside chair. 2/4 DOE. Pt currently requires variable setup to total assist for self-care and functional transfer tasks. Pt reports that she spends majority of her day in a w/c completing ADLs and transfers from a w/c level. Pt reports using 2L [place] [address] at ALF and is currently on 2L [place] at the hospital with O2 SATs in the 90s. Pt demonstrates decreased ROM, strength, endurance, balance, standing tolerance, and activity tolerance impacting ability to complete self-care and functional transfer tasks. Recommend skilled OT services to address above deficits in order to promote function and prevent further decline. Recommend [facility] OT for continued rehab following hospital discharge.     05/06/19 0807  OT Visit Information  Last OT Received On 05/06/19  Assistance Needed +1  PT/OT/SLP Co-Evaluation/Treatment Yes  Reason for Co-Treatment For patient/therapist safety;To address functional/ADL transfers  OT goals addressed during session ADL's and self-care  History of Present Illness Joyce Ortiz is a 78 y.o. female with medical history significant of COPD, oxygen dependent on 2 L nasal cannula oxygen at baseline, paroxysmal atrial fibrillation, DM type 2 with neuropathy, hypertension, CHF, CVA with residual weakness, and GERD.  She presents with complaints of 2-week history of productive cough and shortness of breath.  Patient was not able to give much history due to shortness of breath, but her daughter provide additional history over the phone.  Her daughter had talked to her last night and noted that she was wheezing and was very congested.  Over the last 2 days the patient had deteriorated and O2 saturations  were noted to be in the 80s on her normal oxygen requirements of 2 L.  They had not formally tested her at the facility, but there were 12 of the residents that they had placed in quarantine yesterday.  Precautions  Precautions Fall  Restrictions  Weight Bearing Restrictions No  Home Living  Family/patient expects to be discharged to: Assisted living  [place] - 4 wheels;BSC;Shower seat  Prior Function  Level of Independence Needs assistance  Gait / Transfers Assistance Needed Pt does not ambulate, instead stand pivot transfers to/from surfaces with assist. Pt has a w/c that she uses.   ADL's / Homemaking Assistance Needed Pt reports that she requires assist for all self-care tasks. Pt able to feed herself with setup. Pt reports that staff have to assist her with grooming/hygiene, toileting, dressing, and showering tasks. Unsure of how much assistance they provide.   Communication  Communication No difficulties (Speech is very quiet)  Pain Assessment  Pain Assessment 0-10  Pain Score 5  Pain Location feet  Pain Descriptors / Indicators Discomfort  Pain Intervention(s) Limited activity within patient's tolerance;Monitored during session  Cognition  Arousal/Alertness Awake/alert  Behavior During Therapy WFL for tasks assessed/performed  Overall Cognitive Status No family/caregiver present to determine baseline cognitive functioning  General Comments Pt able to follow simple one step commands.   Upper Extremity Assessment  Upper Extremity Assessment LUE deficits/detail;RUE deficits/detail  RUE Deficits / Details Previous CVA with residual deficits. Shld flex ~20 degrees. Tone noted in right hand.  LUE Deficits / Details Shld flex ~45 degrees.   Lower Extremity Assessment  Lower Extremity Assessment Defer to PT evaluation  ADL  Overall ADL's  Needs assistance/impaired  Eating/Feeding Set up;Sitting  Grooming Set up;Sitting;Wash/dry hands;Wash/dry face  Upper Body Bathing  Moderate assistance;Sitting;Maximal assistance  Lower Body Bathing Total assistance;Sitting/lateral leans;Sit to/from stand  Upper Body Dressing  Moderate assistance;Sitting;Maximal assistance  Lower Body Dressing Minimal assistance;Sitting/lateral leans (To don/doff socks while seated EOB)  Toilet Transfer Minimal assistance;BSC (hand held assist)  Toileting- Clothing Manipulation and Hygiene Moderate assistance;Sitting/lateral lean;Sit to/from stand  Functional mobility during ADLs Minimal assistance (hand held assist)  Bed Mobility  Overal bed mobility Needs Assistance  Bed Mobility Supine to Sit  Supine to sit Min guard;HOB elevated  General bed mobility comments Use of bedrails  Transfers  Overall transfer level Needs assistance  Equipment used 1 person hand held assist  Transfers Sit to/from [employer] Transfers  Sit to Stand Min assist  Stand pivot transfers Min assist  General transfer comment Pt able to transfer to/from [facility] and to bedside chair with hand held assist. Noted 0 instances of LOB, however pt unsteady on her feet.  Balance  Overall balance assessment Needs assistance  Sitting balance-Leahy Scale Fair  Standing balance-Leahy Scale Fair  General Comments  General comments (skin integrity, edema, etc.) Pt on 2L [place] with O2 SATs maintaining in the 90s throughout.  Exercises  Exercises Other exercises  Other Exercises  Other Exercises Incentive spirometer x 10 with mod cues on technique. Pulling 566mL  Other Exercises Flutter valve x 10 with min cues on technique.  OT - End of Session  Equipment Utilized During Treatment Oxygen  Activity Tolerance Patient limited by fatigue (Limited by SOB and motivation)  Patient left in chair;with call bell/phone within reach;with chair alarm set  Nurse Communication Mobility status  OT Assessment  OT Recommendation/Assessment Patient needs continued OT Services  OT Visit Diagnosis Unsteadiness on feet (R26.81);Muscle  weakness (generalized) (M62.81)  OT Problem List Decreased strength;Decreased range of motion;Decreased activity tolerance;Impaired balance (sitting and/or standing);Decreased safety awareness;Decreased knowledge of use of DME or AE;Cardiopulmonary status limiting activity  OT Plan  OT Frequency (ACUTE ONLY) Min 2X/week  OT Treatment/Interventions (ACUTE ONLY) Self-care/ADL training;Therapeutic exercise;Neuromuscular education;Energy conservation;DME and/or AE instruction;Therapeutic activities;Patient/family education;Balance training  AM-PAC OT "6 Clicks" Daily Activity Outcome Measure (Version 2)  Help from another person eating meals? 3  Help from another person taking care of personal grooming? 3  Help from another person toileting, which includes using toliet, bedpan, or urinal? 2  Help from another person bathing (including washing, rinsing, drying)? 2  Help from another person to put on and taking off regular upper body clothing? 2  Help from another person to put on and taking off regular lower body clothing? 3  6 Click Score 15  OT Recommendation  Follow Up Recommendations Supervision/Assistance - 24 hour;Other (comment);Home health OT (ALF)  OT Equipment None recommended by OT  Acute Rehab OT Goals  Patient Stated Goal Agreed to sit up in recliner  Time For Goal Achievement 05/20/19  Potential to Achieve Goals Good  OT Time Calculation  OT Start Time (ACUTE ONLY) 0756  OT Stop Time (ACUTE ONLY) 0830  OT Time Calculation (min) 34 min  OT General Charges  $OT Visit 1 Visit  OT Evaluation  $OT Eval Moderate Complexity 1 Mod  Written Expression  Dominant Hand Right    Mauri Brooklyn OTR/L [phone]

## 2019-05-07 DIAGNOSIS — U071 COVID-19: Secondary | ICD-10-CM | POA: Diagnosis not present

## 2019-05-07 LAB — COMPREHENSIVE METABOLIC PANEL
ALT: 40 U/L (ref 0–44)
AST: 30 U/L (ref 15–41)
Albumin: 3 g/dL — ABNORMAL LOW (ref 3.5–5.0)
Alkaline Phosphatase: 53 U/L (ref 38–126)
Anion gap: 12 (ref 5–15)
BUN: 58 mg/dL — ABNORMAL HIGH (ref 8–23)
CO2: 28 mmol/L (ref 22–32)
Calcium: 8.4 mg/dL — ABNORMAL LOW (ref 8.9–10.3)
Chloride: 99 mmol/L (ref 98–111)
Creatinine, Ser: 1.66 mg/dL — ABNORMAL HIGH (ref 0.44–1.00)
GFR calc Af Amer: 34 mL/min — ABNORMAL LOW (ref >60)
GFR calc non Af Amer: 29 mL/min — ABNORMAL LOW (ref >60)
Glucose, Bld: 171 mg/dL — ABNORMAL HIGH (ref 70–99)
Potassium: 4 mmol/L (ref 3.5–5.1)
Sodium: 139 mmol/L (ref 135–145)
Total Bilirubin: 0.7 mg/dL (ref 0.3–1.2)
Total Protein: 6 g/dL — ABNORMAL LOW (ref 6.5–8.1)

## 2019-05-07 LAB — GLUCOSE, CAPILLARY
Glucose-Capillary: 236 mg/dL — ABNORMAL HIGH (ref 70–99)
Glucose-Capillary: 123 mg/dL — ABNORMAL HIGH (ref 70–99)
Glucose-Capillary: 134 mg/dL — ABNORMAL HIGH (ref 70–99)
Glucose-Capillary: 153 mg/dL — ABNORMAL HIGH (ref 70–99)

## 2019-05-07 LAB — CULTURE, BLOOD (ROUTINE X 2)
Culture: NO GROWTH
Special Requests: ADEQUATE

## 2019-05-07 MED ORDER — LABETALOL HCL 100 MG PO TABS: 50.0000 mg | ORAL_TABLET | Freq: Two times a day (BID) | ORAL | 0 refills | Status: AC

## 2019-05-07 NOTE — Care Management Important Message (Signed)
Important Message  Patient Details  Name: MARNY GOSSARD MRN: [id number] Date of Birth: 27-Mar-1941   Medicare Important Message Given:  Yes - Important Message mailed due to current National Emergency  Verbal consent obtained due to current National Emergency  Relationship to patient: Child Contact Name: Cannon Kettle Call Date: 05/07/19  Time: 1445 Phone: [phone] Outcome: Spoke with contact Important Message mailed to: Emergency contact on file    Delorse Lek 05/07/2019, 2:46 PM

## 2019-05-07 NOTE — Discharge Summary (Signed)
DISCHARGE SUMMARY  Joyce Ortiz  MR#: [id number]  DOB:1941/03/24  Date of Admission: 05/02/2019 Date of Discharge: 05/07/2019  Attending Physician:Joyce Moncrieffe Hennie Duos, MD  Patient's EB:[id number], Joyce Charters, MD  Consults: none   Disposition: d/c back to her ALF   Date of Positive COVID Test: 05/02/2019  COVID-19 specific Treatment: Decadron 12/3 > 12/7 Remdesivir 12/3 > 12/7  Follow-up Appts: Follow-up Information    Hague, Joyce Charters, MD Follow up in 1 week(s).   Specialty: Internal Medicine Contact information: [address] [place] [address] [phone]           Tests Needing Follow-up: -assess volume status  -check BP control -check O2 sats - tolerance of 2LPM [place] O2   Discharge Diagnoses: COVID Pneumonia Acute on chronic hypoxic respiratory failure CKD stage IV COPD HTN Sinus bradycardia DM 2 with polyneuropathy CHF - unknown details Anxiety/depression Paroxysmal atrial fibrillation Chronic thrombocytopenia Chronic debility and dysphagia 1 of 2 Blood Cx + coag negative staph species (05/02/2019) DNR - NO CODE BLUE   Initial presentation: 78 year old resident of a long term care facility with a history of COPD, chronic hypoxic respiratory failure requiring 2 L nasal cannula, paroxysmal atrial fibrillation, DM 2 with neuropathy, HTN, CHF, Thrombotic CVA, limited ambulatory status, and GERD who presented to the ED with a 2-week history of productive cough and shortness of breath.  She noted her oxygen saturation dropping into the 80s at her assisted living facility.  In the ED she was found to be Covid positive and required 4 L of nasal cannula support.  A CXR noted bibasilar atelectasis.  Hospital Course: 12/3 admit via [facility] - positive Covid test 12/4 transfer to [facility] 12/8 d/c   COVID Pneumonia - acute on chronic hypoxic respiratory failure Completed a 5-day course of remdesivir -stopped steroid after 5 days of treatment was  patient developing probable symptoms of uremia and had been weaned back to her home oxygen therapy - no indication for other therapies as patient was improving from a respiratory standpoint- CXR without clear infiltrate but w/ chronic appearing interstitial markings - was weaned back to her home oxygen dose prior to d/c   CKD stage IV Baseline creatinine appears to be approximately 1.8-2.0 -creatinine stable at baseline during this admit   COPD Well compensated -no wheezing at time of discharge  HTN No changes in treatment regimen required during this hospital stay  Sinus bradycardia Heart rate is stable and patient is asymptomatic - choose medications carefully -follow trend  DM 2 with polyneuropathy CBG reasonably controlled -no change in treatment -A1c 6.3  CHF - unknown details /no quantification or qualification available No echocardiogram available in the system to provide further detail -net positive approximately 3000 cc since admission - no gross volume overload on exam at time of discharge  Anxiety/depression well compensated at present  Paroxysmal atrial fibrillation Extensive review of records from care everywhere suggest patient was previously on Eliquis but this was discontinued in December 2019 at the time of a GI bleed -sinus bradycardia majority of this hospital stay  Chronic thrombocytopenia Platelet count stable and only modestly depressed  Chronic debility and dysphagia Related to prior CVA - has refused SLP evaluation or modification of diet - lives in an ALF  1 of 2 Blood Cx + coag negative staph species (05/02/2019) Most consistent with a contaminant - no clinical indication of active bacteremia    Allergies as of 05/07/2019      Reactions  Aspirin    Other reaction(s): GI Upset (intolerance)   Biaxin [clarithromycin] Other (See Comments)   On MAR   Codeine Nausea And Vomiting   Moxifloxacin Itching      Medication List    TAKE these  medications   acetaminophen 325 MG tablet Commonly known as: TYLENOL Take 650 mg by mouth every 4 (four) hours as needed for mild pain.   amLODipine 10 MG tablet Commonly known as: NORVASC Take 10 mg by mouth daily.   atorvastatin 40 MG tablet Commonly known as: LIPITOR Take 40 mg by mouth daily.   bisacodyl 5 MG EC tablet Commonly known as: DULCOLAX Take 5 mg by mouth daily as needed for moderate constipation.   brimonidine 0.1 % Soln Commonly known as: ALPHAGAN P Place 1 drop into both eyes 3 (three) times daily.   DULoxetine 60 MG capsule Commonly known as: CYMBALTA Take 60 mg by mouth daily.   escitalopram 5 MG tablet Commonly known as: LEXAPRO Take 5 mg by mouth daily.   folic acid 1 MG tablet Commonly known as: FOLVITE Take 1 mg by mouth daily.   furosemide 40 MG tablet Commonly known as: LASIX Take 40 mg by mouth daily.   gabapentin 100 MG capsule Commonly known as: NEURONTIN Take 100 mg by mouth 2 (two) times daily.   Icy Hot 5 % Ptch Generic drug: Menthol Apply 1 patch topically 2 (two) times daily.   ipratropium-albuterol 0.5-2.5 (3) MG/3ML Soln Commonly known as: DUONEB Take 3 mLs by nebulization every 6 (six) hours as needed (wheezing/SOB).   labetalol 100 MG tablet Commonly known as: NORMODYNE Take 0.5 tablets (50 mg total) by mouth 2 (two) times daily. What changed: how much to take   latanoprost 0.005 % ophthalmic solution Commonly known as: XALATAN Place 1 drop into both eyes at bedtime.   methotrexate 2.5 MG tablet Commonly known as: RHEUMATREX Take 7.5 mg by mouth every Monday.   multivitamins with iron Tabs tablet Take 1 tablet by mouth daily.   ondansetron 4 MG tablet Commonly known as: ZOFRAN Take 4 mg by mouth every 4 (four) hours as needed for nausea or vomiting.   oxycodone 5 MG capsule Commonly known as: OXY-IR Take 10 mg by mouth every 6 (six) hours as needed for pain.   pantoprazole 40 MG tablet Commonly known as:  PROTONIX Take 40 mg by mouth daily.   predniSONE 5 MG tablet Commonly known as: DELTASONE Take 5 mg by mouth daily.   Systane 0.4-0.3 % Soln Generic drug: Polyethyl Glycol-Propyl Glycol Place 1 drop into both eyes 2 (two) times daily.       Day of Discharge BP (!) 160/74 (BP Location: Right Arm)   Pulse (!) 54   Temp 97.9 F (36.6 C) (Oral)   Resp 18   Ht 5\' 2"  (1.575 m)   Wt 67.8 kg   SpO2 97%   BMI 27.34 kg/m   Physical Exam: General: No acute respiratory distress Lungs: Mild bibasilar crackles with no wheezing with good air movement throughout other fields Cardiovascular: Regular rate and rhythm without murmur gallop or rub normal S1 and S2 Abdomen: Nontender, nondistended, soft, bowel sounds positive, no rebound, no ascites, no appreciable mass Extremities: Trace bilateral lower extremity edema  Basic Metabolic Panel: Recent Labs  Lab 05/02/19 1440 05/03/19 0257 05/04/19 0122 05/05/19 0111 05/07/19 0608  NA 137 139 142 140 139  K 4.0 4.1 4.1 4.3 4.0  CL 97* 100 98 98 99  CO2 30  28 30 29 28   GLUCOSE 120* 153* 133* 131* 171*  BUN 28* 33* 54* 60* 58*  CREATININE 2.01* 1.94* 2.01* 2.00* 1.66*  CALCIUM 8.2* 8.4* 8.3* 8.2* 8.4*  MG  --  2.1 2.3 2.1  --   PHOS  --  5.1*  --   --   --     Liver Function Tests: Recent Labs  Lab 05/02/19 1440 05/03/19 0257 05/04/19 0122 05/05/19 0111 05/07/19 0608  AST 30 35 36 38 30  ALT 25 28 29 31  40  ALKPHOS 60 62 52 50 53  BILITOT 0.9 0.8 0.5 0.7 0.7  PROT 6.1* 6.3* 6.1* 6.0* 6.0*  ALBUMIN 3.1* 3.2* 2.9* 3.0* 3.0*   CBC: Recent Labs  Lab 05/02/19 1338 05/02/19 1440 05/03/19 0257 05/04/19 0122 05/05/19 0111  WBC  --  7.6 5.9 8.7 9.9  NEUTROABS  --   --  5.2 7.1 8.3*  HGB 11.9* 12.0 13.2 11.6* 11.4*  HCT 35.0* 37.0 40.7 35.9* 35.3*  MCV  --  96.9 96.0 97.3 96.4  PLT  --  146* 148* 162 173    CBG: Recent Labs  Lab 05/06/19 1140 05/06/19 1710 05/06/19 2042 05/07/19 0808 05/07/19 1253  GLUCAP 114*  192* 236* 123* 134*    Recent Results (from the past 240 hour(s))  Blood culture (routine x 2)     Status: None   Collection Time: 05/02/19  2:28 PM   Specimen: BLOOD  Result Value Ref Range Status   Specimen Description BLOOD RIGHT ANTECUBITAL  Final   Special Requests   Final    BOTTLES DRAWN AEROBIC AND ANAEROBIC Blood Culture adequate volume   Culture   Final    NO GROWTH 5 DAYS Performed at [facility], [place] [address] [place],  [address]    Report Status 05/07/2019 FINAL  Final  Blood culture (routine x 2)     Status: Abnormal   Collection Time: 05/02/19  2:30 PM   Specimen: BLOOD  Result Value Ref Range Status   Specimen Description BLOOD LEFT ANTECUBITAL  Final   Special Requests   Final    BOTTLES DRAWN AEROBIC ONLY Blood Culture results may not be optimal due to an inadequate volume of blood received in culture bottles   Culture  Setup Time   Final    GRAM POSITIVE COCCI IN CLUSTERS AEROBIC BOTTLE ONLY CRITICAL RESULT CALLED TO, READ BACK BY AND VERIFIED WITH: T. MEYER,PHARMD [id number] 05/04/2019 T. TYSOR    Culture (A)  Final    STAPHYLOCOCCUS SPECIES (COAGULASE NEGATIVE) THE SIGNIFICANCE OF ISOLATING THIS ORGANISM FROM A SINGLE SET OF BLOOD CULTURES WHEN MULTIPLE SETS ARE DRAWN IS UNCERTAIN. PLEASE NOTIFY THE MICROBIOLOGY DEPARTMENT WITHIN ONE WEEK IF SPECIATION AND SENSITIVITIES ARE REQUIRED. Performed at [facility], [place] [address], [place],  [address]    Report Status 05/06/2019 FINAL  Final     Time spent in discharge (includes decision making & examination of pt): 35 minutes  05/07/2019, 1:52 PM   Cherene Altes, MD [facility] Office  [phone]

## 2019-05-07 NOTE — Discharge Instructions (Signed)
COVID-19 °COVID-19 is a respiratory infection that is caused by a virus called severe acute respiratory syndrome coronavirus 2 (SARS-CoV-2). The disease is also known as coronavirus disease or novel coronavirus. In some people, the virus may not cause any symptoms. In others, it may cause a serious infection. The infection can get worse quickly and can lead to complications, such as: °· Pneumonia, or infection of the lungs. °· Acute respiratory distress syndrome or ARDS. This is fluid build-up in the lungs. °· Acute respiratory failure. This is a condition in which there is not enough oxygen passing from the lungs to the body. °· Sepsis or septic shock. This is a serious bodily reaction to an infection. °· Blood clotting problems. °· Secondary infections due to bacteria or fungus. °The virus that causes COVID-19 is contagious. This means that it can spread from person to person through droplets from coughs and sneezes (respiratory secretions). °What are the causes? °This illness is caused by a virus. You may catch the virus by: °· Breathing in droplets from an infected person's cough or sneeze. °· Touching something, like a table or a doorknob, that was exposed to the virus (contaminated) and then touching your mouth, nose, or eyes. °What increases the risk? °Risk for infection °You are more likely to be infected with this virus if you: °· Live in or travel to an area with a COVID-19 outbreak. °· Come in contact with a sick person who recently traveled to an area with a COVID-19 outbreak. °· Provide care for or live with a person who is infected with COVID-19. °Risk for serious illness °You are more likely to become seriously ill from the virus if you: °· Are 65 years of age or older. °· Have a long-term disease that lowers your body's ability to fight infection (immunocompromised). °· Live in a nursing home or long-term care facility. °· Have a long-term (chronic) disease such as: °? Chronic lung disease, including  chronic obstructive pulmonary disease or asthma °? Heart disease. °? Diabetes. °? Chronic kidney disease. °? Liver disease. °· Are obese. °What are the signs or symptoms? °Symptoms of this condition can range from mild to severe. Symptoms may appear any time from 2 to 14 days after being exposed to the virus. They include: °· A fever. °· A cough. °· Difficulty breathing. °· Chills. °· Muscle pains. °· A sore throat. °· Loss of taste or smell. °Some people may also have stomach problems, such as nausea, vomiting, or diarrhea. °Other people may not have any symptoms of COVID-19. °How is this diagnosed? °This condition may be diagnosed based on: °· Your signs and symptoms, especially if: °? You live in an area with a COVID-19 outbreak. °? You recently traveled to or from an area where the virus is common. °? You provide care for or live with a person who was diagnosed with COVID-19. °· A physical exam. °· Lab tests, which may include: °? A nasal swab to take a sample of fluid from your nose. °? A throat swab to take a sample of fluid from your throat. °? A sample of mucus from your lungs (sputum). °? Blood tests. °· Imaging tests, which may include, X-rays, CT scan, or ultrasound. °How is this treated? °At present, there is no medicine to treat COVID-19. Medicines that treat other diseases are being used on a trial basis to see if they are effective against COVID-19. °Your health care provider will talk with you about ways to treat your symptoms. For most   people, the infection is mild and can be managed at home with rest, fluids, and over-the-counter medicines. °Treatment for a serious infection usually takes places in a hospital intensive care unit (ICU). It may include one or more of the following treatments. These treatments are given until your symptoms improve. °· Receiving fluids and medicines through an IV. °· Supplemental oxygen. Extra oxygen is given through a tube in the nose, a face mask, or a  hood. °· Positioning you to lie on your stomach (prone position). This makes it easier for oxygen to get into the lungs. °· Continuous positive airway pressure (CPAP) or bi-level positive airway pressure (BPAP) machine. This treatment uses mild air pressure to keep the airways open. A tube that is connected to a motor delivers oxygen to the body. °· Ventilator. This treatment moves air into and out of the lungs by using a tube that is placed in your windpipe. °· Tracheostomy. This is a procedure to create a hole in the neck so that a breathing tube can be inserted. °· Extracorporeal membrane oxygenation (ECMO). This procedure gives the lungs a chance to recover by taking over the functions of the heart and lungs. It supplies oxygen to the body and removes carbon dioxide. °Follow these instructions at home: °Lifestyle °· If you are sick, stay home except to get medical care. Your health care provider will tell you how long to stay home. Call your health care provider before you go for medical care. °· Rest at home as told by your health care provider. °· Do not use any products that contain nicotine or tobacco, such as cigarettes, e-cigarettes, and chewing tobacco. If you need help quitting, ask your health care provider. °· Return to your normal activities as told by your health care provider. Ask your health care provider what activities are safe for you. °General instructions °· Take over-the-counter and prescription medicines only as told by your health care provider. °· Drink enough fluid to keep your urine pale yellow. °· Keep all follow-up visits as told by your health care provider. This is important. °How is this prevented? ° °There is no vaccine to help prevent COVID-19 infection. However, there are steps you can take to protect yourself and others from this virus. °To protect yourself:  °· Do not travel to areas where COVID-19 is a risk. The areas where COVID-19 is reported change often. To identify  high-risk areas and travel restrictions, check the CDC travel website: wwwnc.cdc.gov/travel/notices °· If you live in, or must travel to, an area where COVID-19 is a risk, take precautions to avoid infection. °? Stay away from people who are sick. °? Wash your hands often with soap and water for 20 seconds. If soap and water are not available, use an alcohol-based hand sanitizer. °? Avoid touching your mouth, face, eyes, or nose. °? Avoid going out in public, follow guidance from your state and local health authorities. °? If you must go out in public, wear a cloth face covering or face mask. °? Disinfect objects and surfaces that are frequently touched every day. This may include: °§ Counters and tables. °§ Doorknobs and light switches. °§ Sinks and faucets. °§ Electronics, such as phones, remote controls, keyboards, computers, and tablets. °To protect others: °If you have symptoms of COVID-19, take steps to prevent the virus from spreading to others. °· If you think you have a COVID-19 infection, contact your health care provider right away. Tell your health care team that you think you may   have a COVID-19 infection. °· Stay home. Leave your house only to seek medical care. Do not use public transport. °· Do not travel while you are sick. °· Wash your hands often with soap and water for 20 seconds. If soap and water are not available, use alcohol-based hand sanitizer. °· Stay away from other members of your household. Let healthy household members care for children and pets, if possible. If you have to care for children or pets, wash your hands often and wear a mask. If possible, stay in your own room, separate from others. Use a different bathroom. °· Make sure that all people in your household wash their hands well and often. °· Cough or sneeze into a tissue or your sleeve or elbow. Do not cough or sneeze into your hand or into the air. °· Wear a cloth face covering or face mask. °Where to find more  information °· Centers for Disease Control and Prevention: www.cdc.gov/coronavirus/2019-ncov/index.html °· World Health Organization: www.who.int/health-topics/coronavirus °Contact a health care provider if: °· You live in or have traveled to an area where COVID-19 is a risk and you have symptoms of the infection. °· You have had contact with someone who has COVID-19 and you have symptoms of the infection. °Get help right away if: °· You have trouble breathing. °· You have pain or pressure in your chest. °· You have confusion. °· You have bluish lips and fingernails. °· You have difficulty waking from sleep. °· You have symptoms that get worse. °These symptoms may represent a serious problem that is an emergency. Do not wait to see if the symptoms will go away. Get medical help right away. Call your local emergency services (911 in the [place] Do not drive yourself to the hospital. Let the emergency medical personnel know if you think you have COVID-19. °Summary °· COVID-19 is a respiratory infection that is caused by a virus. It is also known as coronavirus disease or novel coronavirus. It can cause serious infections, such as pneumonia, acute respiratory distress syndrome, acute respiratory failure, or sepsis. °· The virus that causes COVID-19 is contagious. This means that it can spread from person to person through droplets from coughs and sneezes. °· You are more likely to develop a serious illness if you are 65 years of age or older, have a weak immunity, live in a nursing home, or have chronic disease. °· There is no medicine to treat COVID-19. Your health care provider will talk with you about ways to treat your symptoms. °· Take steps to protect yourself and others from infection. Wash your hands often and disinfect objects and surfaces that are frequently touched every day. Stay away from people who are sick and wear a mask if you are sick. °This information is not intended to replace advice given to you by  your health care provider. Make sure you discuss any questions you have with your health care provider. °Document Released: 06/21/2018 Document Revised: 10/11/2018 Document Reviewed: 06/21/2018 °Elsevier Patient Education © 2020 [employer] ° ° ° °COVID-19 Frequently Asked Questions °COVID-19 (coronavirus disease) is an infection that is caused by a large family of viruses. Some viruses cause illness in people and others cause illness in animals like camels, cats, and bats. In some cases, the viruses that cause illness in animals can spread to humans. °Where did the coronavirus come from? °In December 2019, [place] told the World Health Organization (WHO) of several cases of lung disease (human respiratory illness). These cases were linked to   an open seafood and livestock market in the city of [place]. The link to the seafood and livestock market suggests that the virus may have spread from animals to humans. However, since that first outbreak in December, the virus has also been shown to spread from person to person. °What is the name of the disease and the virus? °Disease name °Early on, this disease was called novel coronavirus. This is because scientists determined that the disease was caused by a new (novel) respiratory virus. The World Health Organization (WHO) has now named the disease COVID-19, or coronavirus disease. °Virus name °The virus that causes the disease is called severe acute respiratory syndrome coronavirus 2 (SARS-CoV-2). °More information on disease and virus naming °World Health Organization (WHO): www.who.int/emergencies/diseases/novel-coronavirus-2019/technical-guidance/naming-the-coronavirus-disease-(covid-2019)-and-the-virus-that-causes-it °Who is at risk for complications from coronavirus disease? °Some people may be at higher risk for complications from coronavirus disease. This includes older adults and people who have chronic diseases, such as heart disease, diabetes, and lung  disease. °If you are at higher risk for complications, take these extra precautions: °· Avoid close contact with people who are sick or have a fever or cough. Stay at least 3-6 ft (1-2 m) away from them, if possible. °· Wash your hands often with soap and water for at least 20 seconds. °· Avoid touching your face, mouth, nose, or eyes. °· Keep supplies on hand at home, such as food, medicine, and cleaning supplies. °· Stay home as much as possible. °· Avoid social gatherings and travel. °How does coronavirus disease spread? °The virus that causes coronavirus disease spreads easily from person to person (is contagious). There are also cases of community-spread disease. This means the disease has spread to: °· People who have no known contact with other infected people. °· People who have not traveled to areas where there are known cases. °It appears to spread from one person to another through droplets from coughing or sneezing. °Can I get the virus from touching surfaces or objects? °There is still a lot that we do not know about the virus that causes coronavirus disease. Scientists are basing a lot of information on what they know about similar viruses, such as: °· Viruses cannot generally survive on surfaces for long. They need a human body (host) to survive. °· It is more likely that the virus is spread by close contact with people who are sick (direct contact), such as through: °? Shaking hands or hugging. °? Breathing in respiratory droplets that travel through the air. This can happen when an infected person coughs or sneezes on or near other people. °· It is less likely that the virus is spread when a person touches a surface or object that has the virus on it (indirect contact). The virus may be able to enter the body if the person touches a surface or object and then touches his or her face, eyes, nose, or mouth. °Can a person spread the virus without having symptoms of the disease? °It may be possible for  the virus to spread before a person has symptoms of the disease, but this is most likely not the main way the virus is spreading. It is more likely for the virus to spread by being in close contact with people who are sick and breathing in the respiratory droplets of a sick person's cough or sneeze. °What are the symptoms of coronavirus disease? °Symptoms vary from person to person and can range from mild to severe. Symptoms may include: °· Fever. °· Cough. °· Tiredness,   weakness, or fatigue. °· Fast breathing or feeling short of breath. °These symptoms can appear anywhere from 2 to 14 days after you have been exposed to the virus. If you develop symptoms, call your health care provider. People with severe symptoms may need hospital care. °If I am exposed to the virus, how long does it take before symptoms start? °Symptoms of coronavirus disease may appear anywhere from 2 to 14 days after a person has been exposed to the virus. If you develop symptoms, call your health care provider. °Should I be tested for this virus? °Your health care provider will decide whether to test you based on your symptoms, history of exposure, and your risk factors. °How does a health care provider test for this virus? °Health care providers will collect samples to send for testing. Samples may include: °· Taking a swab of fluid from the nose. °· Taking fluid from the lungs by having you cough up mucus (sputum) into a sterile cup. °· Taking a blood sample. °· Taking a stool or urine sample. °Is there a treatment or vaccine for this virus? °Currently, there is no vaccine to prevent coronavirus disease. Also, there are no medicines like antibiotics or antivirals to treat the virus. A person who becomes sick is given supportive care, which means rest and fluids. A person may also relieve his or her symptoms by using over-the-counter medicines that treat sneezing, coughing, and runny nose. These are the same medicines that a person takes for  the common cold. °If you develop symptoms, call your health care provider. People with severe symptoms may need hospital care. °What can I do to protect myself and my family from this virus? ° °  ° °You can protect yourself and your family by taking the same actions that you would take to prevent the spread of other viruses. Take the following actions: °· Wash your hands often with soap and water for at least 20 seconds. If soap and water are not available, use alcohol-based hand sanitizer. °· Avoid touching your face, mouth, nose, or eyes. °· Cough or sneeze into a tissue, sleeve, or elbow. Do not cough or sneeze into your hand or the air. °? If you cough or sneeze into a tissue, throw it away immediately and wash your hands. °· Disinfect objects and surfaces that you frequently touch every day. °· Avoid close contact with people who are sick or have a fever or cough. Stay at least 3-6 ft (1-2 m) away from them, if possible. °· Stay home if you are sick, except to get medical care. Call your health care provider before you get medical care. °· Make sure your vaccines are up to date. Ask your health care provider what vaccines you need. °What should I do if I need to travel? °Follow travel recommendations from your local health authority, the CDC, and WHO. °Travel information and advice °· Centers for Disease Control and Prevention (CDC): www.cdc.gov/coronavirus/2019-ncov/travelers/index.html °· World Health Organization (WHO): www.who.int/emergencies/diseases/novel-coronavirus-2019/travel-advice °Know the risks and take action to protect your health °· You are at higher risk of getting coronavirus disease if you are traveling to areas with an outbreak or if you are exposed to travelers from areas with an outbreak. °· Wash your hands often and practice good hygiene to lower the risk of catching or spreading the virus. °What should I do if I am sick? °General instructions to stop the spread of infection °· Wash your  hands often with soap and water for at least 20   seconds. If soap and water are not available, use alcohol-based hand sanitizer. °· Cough or sneeze into a tissue, sleeve, or elbow. Do not cough or sneeze into your hand or the air. °· If you cough or sneeze into a tissue, throw it away immediately and wash your hands. °· Stay home unless you must get medical care. Call your health care provider or local health authority before you get medical care. °· Avoid public areas. Do not take public transportation, if possible. °· If you can, wear a mask if you must go out of the house or if you are in close contact with someone who is not sick. °Keep your home clean °· Disinfect objects and surfaces that are frequently touched every day. This may include: °? Counters and tables. °? Doorknobs and light switches. °? Sinks and faucets. °? Electronics such as phones, remote controls, keyboards, computers, and tablets. °· Wash dishes in hot, soapy water or use a dishwasher. Air-dry your dishes. °· Wash laundry in hot water. °Prevent infecting other household members °· Let healthy household members care for children and pets, if possible. If you have to care for children or pets, wash your hands often and wear a mask. °· Sleep in a different bedroom or bed, if possible. °· Do not share personal items, such as razors, toothbrushes, deodorant, combs, brushes, towels, and washcloths. °Where to find more information °Centers for Disease Control and Prevention (CDC) °· Information and news updates: www.cdc.gov/coronavirus/2019-ncov °World Health Organization (WHO) °· Information and news updates: www.who.int/emergencies/diseases/novel-coronavirus-2019 °· Coronavirus health topic: www.who.int/health-topics/coronavirus °· Questions and answers on COVID-19: www.who.int/news-room/q-a-detail/q-a-coronaviruses °· Global tracker: who.sprinklr.com °American Academy of Pediatrics (AAP) °· Information for families:  www.healthychildren.org/English/health-issues/conditions/chest-lungs/Pages/2019-Novel-Coronavirus.aspx °The coronavirus situation is changing rapidly. Check your local health authority website or the CDC and WHO websites for updates and news. °When should I contact a health care provider? °· Contact your health care provider if you have symptoms of an infection, such as fever or cough, and you: °? Have been near anyone who is known to have coronavirus disease. °? Have come into contact with a person who is suspected to have coronavirus disease. °? Have traveled outside of the country. °When should I get emergency medical care? °· Get help right away by calling your local emergency services (911 in the [place] if you have: °? Trouble breathing. °? Pain or pressure in your chest. °? Confusion. °? Blue-tinged lips and fingernails. °? Difficulty waking from sleep. °? Symptoms that get worse. °Let the emergency medical personnel know if you think you have coronavirus disease. °Summary °· A new respiratory virus is spreading from person to person and causing COVID-19 (coronavirus disease). °· The virus that causes COVID-19 appears to spread easily. It spreads from one person to another through droplets from coughing or sneezing. °· Older adults and those with chronic diseases are at higher risk of disease. If you are at higher risk for complications, take extra precautions. °· There is currently no vaccine to prevent coronavirus disease. There are no medicines, such as antibiotics or antivirals, to treat the virus. °· You can protect yourself and your family by washing your hands often, avoiding touching your face, and covering your coughs and sneezes. °This information is not intended to replace advice given to you by your health care provider. Make sure you discuss any questions you have with your health care provider. °Document Released: 09/11/2018 Document Revised: 09/11/2018 Document Reviewed: 09/11/2018 °Elsevier  Patient Education © 2020 [employer]. ° ° °COVID-19: How to   Protect Yourself and Others °Know how it spreads °· There is currently no vaccine to prevent coronavirus disease 2019 (COVID-19). °· The best way to prevent illness is to avoid being exposed to this virus. °· The virus is thought to spread mainly from person-to-person. °? Between people who are in close contact with one another (within about 6 feet). °? Through respiratory droplets produced when an infected person coughs, sneezes or talks. °? These droplets can land in the mouths or noses of people who are nearby or possibly be inhaled into the lungs. °? Some recent studies have suggested that COVID-19 may be spread by people who are not showing symptoms. °Everyone should °Clean your hands often °· Wash your hands often with soap and water for at least 20 seconds especially after you have been in a public place, or after blowing your nose, coughing, or sneezing. °· If soap and water are not readily available, use a hand sanitizer that contains at least 60% alcohol. Cover all surfaces of your hands and rub them together until they feel dry. °· Avoid touching your eyes, nose, and mouth with unwashed hands. °Avoid close contact °· Stay home if you are sick. °· Avoid close contact with people who are sick. °· Put distance between yourself and other people. °? Remember that some people without symptoms may be able to spread virus. °? This is especially important for people who are at higher risk of getting very sick.www.cdc.gov/coronavirus/2019-ncov/need-extra-precautions/people-at-higher-risk.html °Cover your mouth and nose with a cloth face cover when around others °· You could spread COVID-19 to others even if you do not feel sick. °· Everyone should wear a cloth face cover when they have to go out in public, for example to the grocery store or to pick up other necessities. °? Cloth face coverings should not be placed on young children under age 2, anyone  who has trouble breathing, or is unconscious, incapacitated or otherwise unable to remove the mask without assistance. °· The cloth face cover is meant to protect other people in case you are infected. °· Do NOT use a facemask meant for a healthcare worker. °· Continue to keep about 6 feet between yourself and others. The cloth face cover is not a substitute for social distancing. °Cover coughs and sneezes °· If you are in a private setting and do not have on your cloth face covering, remember to always cover your mouth and nose with a tissue when you cough or sneeze or use the inside of your elbow. °· Throw used tissues in the trash. °· Immediately wash your hands with soap and water for at least 20 seconds. If soap and water are not readily available, clean your hands with a hand sanitizer that contains at least 60% alcohol. °Clean and disinfect °· Clean AND disinfect frequently touched surfaces daily. This includes tables, doorknobs, light switches, countertops, handles, desks, phones, keyboards, toilets, faucets, and sinks. www.cdc.gov/coronavirus/2019-ncov/prevent-getting-sick/disinfecting-your-home.html °· If surfaces are dirty, clean them: Use detergent or soap and water prior to disinfection. °· Then, use a household disinfectant. You can see a list of EPA-registered household disinfectants here. °cdc.gov/coronavirus °10/02/2018 °This information is not intended to replace advice given to you by your health care provider. Make sure you discuss any questions you have with your health care provider. °Document Released: 09/11/2018 Document Revised: 10/10/2018 Document Reviewed: 09/11/2018 °Elsevier Patient Education © 2020 Elsevier Inc. ° °

## 2019-05-07 NOTE — Discharge Planning (Signed)
1415Left message for Social Worker to give me a call regarding authorization for pt to go back to [place].

## 2019-05-07 NOTE — TOC Transition Note (Signed)
Transition of Care [facility]) - CM/SW Discharge Note   Patient Details  Name: Joyce Ortiz MRN: [id number] Date of Birth: 02-07-1941  [facility] of [facility] CM/SW Contact:  Amador Cunas, [place] Phone Number: 05/07/2019, 3:25 PM   Clinical Narrative:   Pt for d/c back to [facility] today. Spoke to [place] at [place] who confirmed pt able to return. DC summary and FL2 faxed to facility [phone]. Notified dtr, Gina, of d/c and she reports agreeable. Per Estill Bamberg at ALF, no need for report. SW singing off at d/c.   Wandra Feinstein, MSW, LCSW [phone] (GV coverage)       Final next level of care: Assisted Living Barriers to Discharge: No Barriers Identified   Patient Goals and CMS Choice        Discharge Placement                Patient to be transferred to facility by: [place] Name of family member notified: Barnett Applebaum, Dtr Patient and family notified of of transfer: 05/07/19  Discharge Plan and Services                                     Social Determinants of Health (SDOH) Interventions     Readmission Risk Interventions No flowsheet data found.

## 2019-05-07 NOTE — Plan of Care (Signed)
Pt slept well during the night. With complaints of chronic knee and neck pain, prn oxycontin given. Alert and oriented, forgetful at times. Vitals stable on 2L [place] per her baseline. Minimal dyspnea on exertion and productive cough noted. Moderate  assist with ADLs. Incontinent of urine overnight, assisted with hygiene needs. IV  SL. Assisted regular repositioning for pressure relief.  No other issues, will monitor.   Problem: Coping: Goal: Psychosocial and spiritual needs will be supported Outcome: Progressing   Problem: Education: Goal: Knowledge of risk factors and measures for prevention of condition will improve Outcome: Progressing   Problem: Respiratory: Goal: Will maintain a patent airway Outcome: Progressing Goal: Complications related to the disease process, condition or treatment will be avoided or minimized Outcome: Progressing   Problem: Activity: Goal: Risk for activity intolerance will decrease Outcome: Progressing   Problem: Coping: Goal: Level of anxiety will decrease Outcome: Progressing

## 2019-05-07 NOTE — Discharge Planning (Signed)
1730 Pt discharged in care of EMS, both Daughters aware Joyce Ortiz and Joyce Ortiz that pt is going back to [facility] in [place], [place] today.  Pt given discharge instructions IV right AC removed cannula intact  Report called to [place] at the facility

## 2019-05-08 ENCOUNTER — Inpatient Hospital Stay (HOSPITAL_COMMUNITY): Payer: Medicare Other

## 2019-05-08 ENCOUNTER — Emergency Department (HOSPITAL_COMMUNITY): Payer: Medicare Other

## 2019-05-08 ENCOUNTER — Inpatient Hospital Stay (HOSPITAL_COMMUNITY)
Admission: EM | Admit: 2019-05-08 | Discharge: 2019-05-13 | DRG: 177 | Disposition: A | Discharge status: Home Health | Payer: Medicare Other | Source: Skilled Nursing Facility | Attending: Internal Medicine | Admitting: Internal Medicine

## 2019-05-08 ENCOUNTER — Other Ambulatory Visit: Payer: Self-pay

## 2019-05-08 ENCOUNTER — Inpatient Hospital Stay: Payer: Self-pay

## 2019-05-08 DIAGNOSIS — R1310 Dysphagia, unspecified: Secondary | ICD-10-CM | POA: Diagnosis present

## 2019-05-08 DIAGNOSIS — F419 Anxiety disorder, unspecified: Secondary | ICD-10-CM | POA: Diagnosis present

## 2019-05-08 DIAGNOSIS — Z87891 Personal history of nicotine dependence: Secondary | ICD-10-CM

## 2019-05-08 DIAGNOSIS — K219 Gastro-esophageal reflux disease without esophagitis: Secondary | ICD-10-CM | POA: Diagnosis present

## 2019-05-08 DIAGNOSIS — N184 Chronic kidney disease, stage 4 (severe): Secondary | ICD-10-CM | POA: Diagnosis present

## 2019-05-08 DIAGNOSIS — Z885 Allergy status to narcotic agent status: Secondary | ICD-10-CM

## 2019-05-08 DIAGNOSIS — Z8619 Personal history of other infectious and parasitic diseases: Secondary | ICD-10-CM

## 2019-05-08 DIAGNOSIS — J159 Unspecified bacterial pneumonia: Secondary | ICD-10-CM | POA: Diagnosis present

## 2019-05-08 DIAGNOSIS — N179 Acute kidney failure, unspecified: Secondary | ICD-10-CM | POA: Diagnosis present

## 2019-05-08 DIAGNOSIS — Z886 Allergy status to analgesic agent status: Secondary | ICD-10-CM

## 2019-05-08 DIAGNOSIS — J690 Pneumonitis due to inhalation of food and vomit: Principal | ICD-10-CM | POA: Diagnosis present

## 2019-05-08 DIAGNOSIS — T380X5A Adverse effect of glucocorticoids and synthetic analogues, initial encounter: Secondary | ICD-10-CM | POA: Diagnosis present

## 2019-05-08 DIAGNOSIS — Z23 Encounter for immunization: Secondary | ICD-10-CM | POA: Diagnosis present

## 2019-05-08 DIAGNOSIS — I10 Essential (primary) hypertension: Secondary | ICD-10-CM | POA: Diagnosis present

## 2019-05-08 DIAGNOSIS — E1142 Type 2 diabetes mellitus with diabetic polyneuropathy: Secondary | ICD-10-CM | POA: Diagnosis present

## 2019-05-08 DIAGNOSIS — Z7952 Long term (current) use of systemic steroids: Secondary | ICD-10-CM

## 2019-05-08 DIAGNOSIS — E1122 Type 2 diabetes mellitus with diabetic chronic kidney disease: Secondary | ICD-10-CM | POA: Diagnosis present

## 2019-05-08 DIAGNOSIS — Z8673 Personal history of transient ischemic attack (TIA), and cerebral infarction without residual deficits: Secondary | ICD-10-CM

## 2019-05-08 DIAGNOSIS — I5033 Acute on chronic diastolic (congestive) heart failure: Secondary | ICD-10-CM | POA: Diagnosis present

## 2019-05-08 DIAGNOSIS — I5031 Acute diastolic (congestive) heart failure: Secondary | ICD-10-CM | POA: Diagnosis not present

## 2019-05-08 DIAGNOSIS — G8929 Other chronic pain: Secondary | ICD-10-CM | POA: Diagnosis present

## 2019-05-08 DIAGNOSIS — E785 Hyperlipidemia, unspecified: Secondary | ICD-10-CM | POA: Diagnosis present

## 2019-05-08 DIAGNOSIS — Z79899 Other long term (current) drug therapy: Secondary | ICD-10-CM

## 2019-05-08 DIAGNOSIS — J440 Chronic obstructive pulmonary disease with acute lower respiratory infection: Secondary | ICD-10-CM | POA: Diagnosis present

## 2019-05-08 DIAGNOSIS — M1990 Unspecified osteoarthritis, unspecified site: Secondary | ICD-10-CM | POA: Diagnosis present

## 2019-05-08 DIAGNOSIS — I482 Chronic atrial fibrillation, unspecified: Secondary | ICD-10-CM | POA: Diagnosis present

## 2019-05-08 DIAGNOSIS — U071 COVID-19: Secondary | ICD-10-CM

## 2019-05-08 DIAGNOSIS — Z9981 Dependence on supplemental oxygen: Secondary | ICD-10-CM | POA: Diagnosis not present

## 2019-05-08 DIAGNOSIS — E1165 Type 2 diabetes mellitus with hyperglycemia: Secondary | ICD-10-CM | POA: Diagnosis present

## 2019-05-08 DIAGNOSIS — Z8701 Personal history of pneumonia (recurrent): Secondary | ICD-10-CM

## 2019-05-08 DIAGNOSIS — R0603 Acute respiratory distress: Secondary | ICD-10-CM | POA: Diagnosis present

## 2019-05-08 DIAGNOSIS — Z66 Do not resuscitate: Secondary | ICD-10-CM | POA: Diagnosis present

## 2019-05-08 DIAGNOSIS — F329 Major depressive disorder, single episode, unspecified: Secondary | ICD-10-CM | POA: Diagnosis present

## 2019-05-08 DIAGNOSIS — Z8719 Personal history of other diseases of the digestive system: Secondary | ICD-10-CM

## 2019-05-08 DIAGNOSIS — J449 Chronic obstructive pulmonary disease, unspecified: Secondary | ICD-10-CM | POA: Diagnosis present

## 2019-05-08 DIAGNOSIS — J069 Acute upper respiratory infection, unspecified: Secondary | ICD-10-CM | POA: Diagnosis present

## 2019-05-08 DIAGNOSIS — I130 Hypertensive heart and chronic kidney disease with heart failure and stage 1 through stage 4 chronic kidney disease, or unspecified chronic kidney disease: Secondary | ICD-10-CM | POA: Diagnosis present

## 2019-05-08 DIAGNOSIS — J9621 Acute and chronic respiratory failure with hypoxia: Secondary | ICD-10-CM | POA: Diagnosis present

## 2019-05-08 DIAGNOSIS — Z881 Allergy status to other antibiotic agents status: Secondary | ICD-10-CM

## 2019-05-08 DIAGNOSIS — Z79891 Long term (current) use of opiate analgesic: Secondary | ICD-10-CM

## 2019-05-08 LAB — POCT I-STAT 7, (LYTES, BLD GAS, ICA,H+H)
Acid-Base Excess: 3 mmol/L — ABNORMAL HIGH (ref 0.0–2.0)
Bicarbonate: 29.2 mmol/L — ABNORMAL HIGH (ref 20.0–28.0)
Calcium, Ion: 1.21 mmol/L (ref 1.15–1.40)
HCT: 39 % (ref 36.0–46.0)
Hemoglobin: 13.3 g/dL (ref 12.0–15.0)
O2 Saturation: 99 %
Patient temperature: 100.2
Potassium: 3.4 mmol/L — ABNORMAL LOW (ref 3.5–5.1)
Sodium: 139 mmol/L (ref 135–145)
TCO2: 31 mmol/L (ref 22–32)
pCO2 arterial: 50 mmHg — ABNORMAL HIGH (ref 32.0–48.0)
pH, Arterial: 7.378 (ref 7.350–7.450)
pO2, Arterial: 131 mmHg — ABNORMAL HIGH (ref 83.0–108.0)

## 2019-05-08 LAB — CBC WITH DIFFERENTIAL/PLATELET
Basophils Absolute: 0 10*3/uL (ref 0.0–0.1)
Basophils Relative: 0 %
Eosinophils Absolute: 1.9 10*3/uL — ABNORMAL HIGH (ref 0.0–0.5)
Eosinophils Relative: 0 %
HCT: 40.7 % (ref 36.0–46.0)
Hemoglobin: 13.8 g/dL (ref 12.0–15.0)
Lymphocytes Relative: 3 %
Lymphs Abs: 0.9 10*3/uL (ref 0.7–4.0)
MCH: 33.1 pg (ref 26.0–34.0)
MCHC: 33.9 g/dL (ref 30.0–36.0)
MCV: 97.6 fL (ref 80.0–100.0)
Monocytes Absolute: 1.9 10*3/uL — ABNORMAL HIGH (ref 0.1–1.0)
Monocytes Relative: 6 %
Neutro Abs: 26.3 10*3/uL — ABNORMAL HIGH (ref 1.7–7.7)
Neutrophils Relative %: 87 %
Platelets: 213 10*3/uL (ref 150–400)
RBC: 4.17 MIL/uL (ref 3.87–5.11)
RDW: 16.9 % — ABNORMAL HIGH (ref 11.5–15.5)
WBC Morphology: INCREASED
WBC: 30.4 10*3/uL — ABNORMAL HIGH (ref 4.0–10.5)
nRBC: 0 % (ref 0.0–0.2)

## 2019-05-08 LAB — LACTATE DEHYDROGENASE: LDH: 336 U/L — ABNORMAL HIGH (ref 98–192)

## 2019-05-08 LAB — BASIC METABOLIC PANEL
Anion gap: 16 — ABNORMAL HIGH (ref 5–15)
BUN: 50 mg/dL — ABNORMAL HIGH (ref 8–23)
CO2: 22 mmol/L (ref 22–32)
Calcium: 8.6 mg/dL — ABNORMAL LOW (ref 8.9–10.3)
Chloride: 102 mmol/L (ref 98–111)
Creatinine, Ser: 1.94 mg/dL — ABNORMAL HIGH (ref 0.44–1.00)
GFR calc Af Amer: 28 mL/min — ABNORMAL LOW (ref >60)
GFR calc non Af Amer: 24 mL/min — ABNORMAL LOW (ref >60)
Glucose, Bld: 93 mg/dL (ref 70–99)
Potassium: 4.4 mmol/L (ref 3.5–5.1)
Sodium: 140 mmol/L (ref 135–145)

## 2019-05-08 LAB — C-REACTIVE PROTEIN: CRP: 8.4 mg/dL — ABNORMAL HIGH (ref <1.0)

## 2019-05-08 LAB — FERRITIN: Ferritin: 112 ng/mL (ref 11–307)

## 2019-05-08 LAB — D-DIMER, QUANTITATIVE: D-Dimer, Quant: 2.43 ug/mL-FEU — ABNORMAL HIGH (ref 0.00–0.50)

## 2019-05-08 LAB — BRAIN NATRIURETIC PEPTIDE: B Natriuretic Peptide: 262.6 pg/mL — ABNORMAL HIGH (ref 0.0–100.0)

## 2019-05-08 LAB — FIBRINOGEN: Fibrinogen: 590 mg/dL — ABNORMAL HIGH (ref 210–475)

## 2019-05-08 LAB — CBG MONITORING, ED: Glucose-Capillary: 136 mg/dL — ABNORMAL HIGH (ref 70–99)

## 2019-05-08 LAB — LACTIC ACID, PLASMA: Lactic Acid, Venous: 1.9 mmol/L (ref 0.5–1.9)

## 2019-05-08 LAB — TRIGLYCERIDES: Triglycerides: 159 mg/dL — ABNORMAL HIGH (ref <150)

## 2019-05-08 LAB — PROCALCITONIN: Procalcitonin: 6.57 ng/mL

## 2019-05-08 MED ORDER — SODIUM CHLORIDE 0.9 % IV SOLN
2.0000 g | INTRAVENOUS | Status: DC
  Administered 2019-05-08: 2 g via INTRAVENOUS
  Filled 2019-05-08: qty 20

## 2019-05-08 MED ORDER — SODIUM CHLORIDE 0.9 % IV SOLN
500.0000 mg | INTRAVENOUS | Status: DC
  Administered 2019-05-08: 500 mg via INTRAVENOUS
  Filled 2019-05-08: qty 500

## 2019-05-08 MED ORDER — ESCITALOPRAM OXALATE 10 MG PO TABS
5.0000 mg | ORAL_TABLET | Freq: Every day | ORAL | Status: DC
  Administered 2019-05-09 – 2019-05-13 (×5): 5 mg via ORAL
  Filled 2019-05-08 (×2): qty 0.5
  Filled 2019-05-08: qty 1
  Filled 2019-05-08: qty 0.5
  Filled 2019-05-08: qty 1

## 2019-05-08 MED ORDER — AMLODIPINE BESYLATE 10 MG PO TABS
10.0000 mg | ORAL_TABLET | Freq: Every day | ORAL | Status: DC
  Administered 2019-05-09 – 2019-05-12 (×4): 10 mg via ORAL
  Filled 2019-05-08 (×3): qty 2
  Filled 2019-05-08: qty 1
  Filled 2019-05-08: qty 2

## 2019-05-08 MED ORDER — CHLORHEXIDINE GLUCONATE CLOTH 2 % EX PADS
6.0000 | MEDICATED_PAD | Freq: Every day | CUTANEOUS | Status: DC
  Administered 2019-05-09 – 2019-05-12 (×5): 6 via TOPICAL

## 2019-05-08 MED ORDER — METHYLPREDNISOLONE SODIUM SUCC 40 MG IJ SOLR
34.0000 mg | Freq: Two times a day (BID) | INTRAMUSCULAR | Status: DC
  Administered 2019-05-08 – 2019-05-09 (×2): 34 mg via INTRAVENOUS
  Filled 2019-05-08: qty 1

## 2019-05-08 MED ORDER — BRIMONIDINE TARTRATE 0.15 % OP SOLN
1.0000 [drp] | Freq: Three times a day (TID) | OPHTHALMIC | Status: DC
  Administered 2019-05-08 – 2019-05-13 (×14): 1 [drp] via OPHTHALMIC
  Filled 2019-05-08 (×2): qty 5

## 2019-05-08 MED ORDER — FUROSEMIDE 10 MG/ML IJ SOLN
40.0000 mg | Freq: Once | INTRAMUSCULAR | Status: AC
  Administered 2019-05-08: 40 mg via INTRAVENOUS
  Filled 2019-05-08: qty 4

## 2019-05-08 MED ORDER — SODIUM CHLORIDE 0.9% FLUSH: 10.0000 mL | INTRAVENOUS | Status: DC | PRN

## 2019-05-08 MED ORDER — SODIUM CHLORIDE 0.9% FLUSH
3.0000 mL | Freq: Two times a day (BID) | INTRAVENOUS | Status: DC
  Administered 2019-05-08 (×2): 3 mL via INTRAVENOUS

## 2019-05-08 MED ORDER — ALBUTEROL SULFATE HFA 108 (90 BASE) MCG/ACT IN AERS
8.0000 | INHALATION_SPRAY | Freq: Once | RESPIRATORY_TRACT | Status: AC
  Administered 2019-05-08: 8 via RESPIRATORY_TRACT
  Filled 2019-05-08: qty 6.7

## 2019-05-08 MED ORDER — IPRATROPIUM BROMIDE HFA 17 MCG/ACT IN AERS
2.0000 | INHALATION_SPRAY | Freq: Once | RESPIRATORY_TRACT | Status: AC
  Administered 2019-05-08: 2 via RESPIRATORY_TRACT
  Filled 2019-05-08: qty 12.9

## 2019-05-08 MED ORDER — OXYCODONE HCL 5 MG PO TABS
5.0000 mg | ORAL_TABLET | ORAL | Status: DC | PRN
  Administered 2019-05-08 – 2019-05-09 (×2): 5 mg via ORAL
  Filled 2019-05-08 (×2): qty 1

## 2019-05-08 MED ORDER — LATANOPROST 0.005 % OP SOLN
1.0000 [drp] | Freq: Every day | OPHTHALMIC | Status: DC
  Administered 2019-05-09 – 2019-05-12 (×4): 1 [drp] via OPHTHALMIC
  Filled 2019-05-08 (×3): qty 2.5

## 2019-05-08 MED ORDER — BISACODYL 5 MG PO TBEC: 5.0000 mg | DELAYED_RELEASE_TABLET | Freq: Every day | ORAL | Status: DC | PRN

## 2019-05-08 MED ORDER — GABAPENTIN 100 MG PO CAPS
100.0000 mg | ORAL_CAPSULE | Freq: Two times a day (BID) | ORAL | Status: DC
  Administered 2019-05-08 – 2019-05-13 (×10): 100 mg via ORAL
  Filled 2019-05-08 (×9): qty 1

## 2019-05-08 MED ORDER — SODIUM CHLORIDE 0.9% FLUSH
3.0000 mL | Freq: Two times a day (BID) | INTRAVENOUS | Status: DC
  Administered 2019-05-08 (×2): 3 mL via INTRAVENOUS

## 2019-05-08 MED ORDER — ONDANSETRON HCL 4 MG/2ML IJ SOLN: 4.0000 mg | Freq: Four times a day (QID) | INTRAMUSCULAR | Status: DC | PRN

## 2019-05-08 MED ORDER — POLYETHYLENE GLYCOL 3350 17 G PO PACK: 17.0000 g | PACK | Freq: Every day | ORAL | Status: DC | PRN

## 2019-05-08 MED ORDER — ATORVASTATIN CALCIUM 40 MG PO TABS
40.0000 mg | ORAL_TABLET | Freq: Every day | ORAL | Status: DC
  Administered 2019-05-09 – 2019-05-13 (×5): 40 mg via ORAL
  Filled 2019-05-08 (×5): qty 1

## 2019-05-08 MED ORDER — PANTOPRAZOLE SODIUM 40 MG PO TBEC
40.0000 mg | DELAYED_RELEASE_TABLET | Freq: Every day | ORAL | Status: DC
  Administered 2019-05-09 – 2019-05-13 (×5): 40 mg via ORAL
  Filled 2019-05-08 (×5): qty 1

## 2019-05-08 MED ORDER — DEXAMETHASONE SODIUM PHOSPHATE 10 MG/ML IJ SOLN
10.0000 mg | Freq: Once | INTRAMUSCULAR | Status: AC
  Administered 2019-05-08: 10 mg via INTRAVENOUS
  Filled 2019-05-08: qty 1

## 2019-05-08 MED ORDER — HEPARIN SODIUM (PORCINE) 5000 UNIT/ML IJ SOLN
5000.0000 [IU] | Freq: Three times a day (TID) | INTRAMUSCULAR | Status: DC
  Administered 2019-05-08 – 2019-05-09 (×2): 5000 [IU] via SUBCUTANEOUS
  Filled 2019-05-08 (×2): qty 1

## 2019-05-08 MED ORDER — INSULIN ASPART 100 UNIT/ML ~~LOC~~ SOLN
0.0000 [IU] | Freq: Every day | SUBCUTANEOUS | Status: DC
  Administered 2019-05-09 – 2019-05-12 (×2): 2 [IU] via SUBCUTANEOUS

## 2019-05-08 MED ORDER — SODIUM CHLORIDE 0.9% FLUSH
10.0000 mL | Freq: Two times a day (BID) | INTRAVENOUS | Status: DC
  Administered 2019-05-08: 10 mL

## 2019-05-08 MED ORDER — SODIUM CHLORIDE 0.9% FLUSH: 3.0000 mL | INTRAVENOUS | Status: DC | PRN

## 2019-05-08 MED ORDER — ACETAMINOPHEN 325 MG PO TABS
650.0000 mg | ORAL_TABLET | Freq: Four times a day (QID) | ORAL | Status: DC | PRN
  Administered 2019-05-08 – 2019-05-11 (×3): 650 mg via ORAL
  Filled 2019-05-08 (×3): qty 2

## 2019-05-08 MED ORDER — INSULIN ASPART 100 UNIT/ML ~~LOC~~ SOLN
0.0000 [IU] | Freq: Three times a day (TID) | SUBCUTANEOUS | Status: DC
  Administered 2019-05-09: 3 [IU] via SUBCUTANEOUS
  Administered 2019-05-09 (×2): 2 [IU] via SUBCUTANEOUS
  Administered 2019-05-10: 12:00:00 1 [IU] via SUBCUTANEOUS
  Administered 2019-05-10: 2 [IU] via SUBCUTANEOUS
  Administered 2019-05-10 – 2019-05-11 (×2): 1 [IU] via SUBCUTANEOUS
  Administered 2019-05-11 (×2): 2 [IU] via SUBCUTANEOUS
  Administered 2019-05-12: 1 [IU] via SUBCUTANEOUS
  Administered 2019-05-12: 3 [IU] via SUBCUTANEOUS
  Administered 2019-05-12: 13:00:00 2 [IU] via SUBCUTANEOUS

## 2019-05-08 MED ORDER — ENOXAPARIN SODIUM 40 MG/0.4ML ~~LOC~~ SOLN: 40.0000 mg | SUBCUTANEOUS | Status: DC

## 2019-05-08 MED ORDER — FLEET ENEMA 7-19 GM/118ML RE ENEM
1.0000 | ENEMA | Freq: Once | RECTAL | Status: DC | PRN
  Filled 2019-05-08: qty 1

## 2019-05-08 MED ORDER — FOLIC ACID 1 MG PO TABS
1.0000 mg | ORAL_TABLET | Freq: Every day | ORAL | Status: DC
  Administered 2019-05-09 – 2019-05-13 (×5): 1 mg via ORAL
  Filled 2019-05-08 (×5): qty 1

## 2019-05-08 MED ORDER — SODIUM CHLORIDE 0.9% FLUSH
10.0000 mL | Freq: Two times a day (BID) | INTRAVENOUS | Status: DC
  Administered 2019-05-08 – 2019-05-13 (×10): 10 mL

## 2019-05-08 MED ORDER — POLYETHYL GLYCOL-PROPYL GLYCOL 0.4-0.3 % OP SOLN: 1.0000 [drp] | Freq: Two times a day (BID) | OPHTHALMIC | Status: DC

## 2019-05-08 MED ORDER — ARTIFICIAL TEARS OPHTHALMIC OINT
TOPICAL_OINTMENT | Freq: Two times a day (BID) | OPHTHALMIC | Status: DC
  Administered 2019-05-08 – 2019-05-13 (×10): via OPHTHALMIC
  Filled 2019-05-08 (×7): qty 3.5

## 2019-05-08 MED ORDER — ONDANSETRON HCL 4 MG PO TABS: 4.0000 mg | ORAL_TABLET | Freq: Four times a day (QID) | ORAL | Status: DC | PRN

## 2019-05-08 MED ORDER — DULOXETINE HCL 60 MG PO CPEP
60.0000 mg | ORAL_CAPSULE | Freq: Every day | ORAL | Status: DC
  Administered 2019-05-09 – 2019-05-13 (×5): 60 mg via ORAL
  Filled 2019-05-08 (×5): qty 1

## 2019-05-08 MED ORDER — LABETALOL HCL 100 MG PO TABS
50.0000 mg | ORAL_TABLET | Freq: Two times a day (BID) | ORAL | Status: DC
  Administered 2019-05-08 – 2019-05-13 (×10): 50 mg via ORAL
  Filled 2019-05-08 (×2): qty 1
  Filled 2019-05-08 (×2): qty 0.5
  Filled 2019-05-08 (×2): qty 1
  Filled 2019-05-08: qty 0.5
  Filled 2019-05-08 (×2): qty 1
  Filled 2019-05-08: qty 0.5
  Filled 2019-05-08: qty 1

## 2019-05-08 MED ORDER — SODIUM CHLORIDE 0.9 % IV SOLN: 250.0000 mL | INTRAVENOUS | Status: DC | PRN

## 2019-05-08 NOTE — ED Triage Notes (Signed)
Pt arrived via EMS from [facility] where the facility called for respiratory distress. Pt normally wears 2L nasal cannula and O2 saturation was 80%. Pt was then placed on 4L by EMS and pt improved to 90%. Pt was then placed on a non-rebreather and is currently at 99% O2 saturation. The nursing facility reported the pt was discharge from this facility today and was recently COVID positive but could not give EMS a date of when the pt tested positive.

## 2019-05-08 NOTE — ED Notes (Signed)
Nuclear Med called and will plan to do scan later this afternoon.  No gross changes with pt at this time.

## 2019-05-08 NOTE — H&P (Signed)
History and Physical    Joyce Ortiz [id number] DOB: 1940/06/19 DOA: 05/08/2019  PCP: Bonnita Nasuti, MD Consultants:  Gean Quint - podiatry; Badreddine - GI Patient coming from: [facility]; NOK: Daughter, [name], [phone] (doesn't work); jina [phone]   Chief Complaint: respiratory distress  HPI: Joyce Ortiz is a 78 y.o. female with medical history significant of CVA; stage IV CKD; HTN; DM; COPD; CHF (uncertain systolic vs. Diastolic); chronic debility and dysphagia (has refused SLP evaluation or modification of diet); and afib (off Eliquis due to GI bleed in 04/2018) presenting with respiratory distress.  She was previously admitted at [facility] from 12/3-8 with  COVID-19-associated PNA, treated with Decadron and Remdesivir, and discharged back to ALF yesterday, 12/8.   After arrival back at her facility, she was found to be in respiratory distress, 80% on her usual [place] O2.  Her O2 was increased to 4L and she improved to 90%.   Her mentation has improved throughout her stay in the ER.   ED Course:  Weaned back to home O2 requirement of 2L.  Today 80s on home O2.  AMS, placed on NRB.  Now weaned back down to home 2L.  CXR looks worse than prior admission, persistent tachypnea with rhonchi.  Also with low-grade fever, increased D-dimer.  Creatinine will not allow for PE study, so VQ scan.  Likely needs readmission for symptoms.  Review of Systems: As per HPI; otherwise review of systems reviewed and negative.    Past Medical History:  Diagnosis Date   Atrial fibrillation (HCC)    CHF (congestive heart failure) (HCC)    COPD (chronic obstructive pulmonary disease) (HCC)    Diabetes mellitus type 2, controlled ([place])    Hypertension    Renal disorder    Respiratory failure, chronic ([place])    Stroke ([place])     No past surgical history on file.  Social History   Socioeconomic History   Marital status: Unknown    Spouse name: Not on file   Number of children: Not on  file   Years of education: Not on file   Highest education level: Not on file  Occupational History   Not on file  Social Needs   Financial resource strain: Not on file   Food insecurity    Worry: Not on file    Inability: Not on file   Transportation needs    Medical: Not on file    Non-medical: Not on file  Tobacco Use   Smoking status: Former Smoker  Substance and Sexual Activity   Alcohol use: Not on file   Drug use: Not on file   Sexual activity: Not on file  Lifestyle   Physical activity    Days per week: Not on file    Minutes per session: Not on file   Stress: Not on file  Relationships   Social connections    Talks on phone: Not on file    Gets together: Not on file    Attends religious service: Not on file    Active member of club or organization: Not on file    Attends meetings of clubs or organizations: Not on file    Relationship status: Not on file   Intimate partner violence    Fear of current or ex partner: Not on file    Emotionally abused: Not on file    Physically abused: Not on file    Forced sexual activity: Not on file  Other Topics  Concern   Not on file  Social History Narrative   Not on file    Allergies  Allergen Reactions   Aspirin     Other reaction(s): GI Upset (intolerance)   Biaxin [Clarithromycin] Other (See Comments)    [facility]    Codeine Nausea And Vomiting   Moxifloxacin Itching    Family History  Problem Relation Age of Onset   Cancer Father     Prior to Admission medications   Medication Sig Start Date End Date Taking? Authorizing Provider  acetaminophen (TYLENOL) 325 MG tablet Take 650 mg by mouth every 4 (four) hours as needed for mild pain.    [provider]  amLODipine (NORVASC) 10 MG tablet Take 10 mg by mouth daily. 04/24/19   [provider]  atorvastatin (LIPITOR) 40 MG tablet Take 40 mg by mouth daily. 04/24/19   [provider]    bisacodyl (DULCOLAX) 5 MG EC tablet Take 5 mg by mouth daily as needed for moderate constipation.    [provider]  brimonidine (ALPHAGAN P) 0.1 % SOLN Place 1 drop into both eyes 3 (three) times daily.    [provider]  DULoxetine (CYMBALTA) 60 MG capsule Take 60 mg by mouth daily. 04/24/19   [provider]  escitalopram (LEXAPRO) 5 MG tablet Take 5 mg by mouth daily. 04/24/19   [provider]  folic acid (FOLVITE) 1 MG tablet Take 1 mg by mouth daily.    [provider]  furosemide (LASIX) 40 MG tablet Take 40 mg by mouth daily. 04/24/19   [provider]  gabapentin (NEURONTIN) 100 MG capsule Take 100 mg by mouth 2 (two) times daily. 05/01/19   [provider]  ipratropium-albuterol (DUONEB) 0.5-2.5 (3) MG/3ML SOLN Take 3 mLs by nebulization every 6 (six) hours as needed (wheezing/SOB).    [provider]  labetalol (NORMODYNE) 100 MG tablet Take 0.5 tablets (50 mg total) by mouth 2 (two) times daily. 05/07/19   Cherene Altes, MD  latanoprost (XALATAN) 0.005 % ophthalmic solution Place 1 drop into both eyes at bedtime. 04/24/19   [provider]  Menthol (ICY HOT) 5 % PTCH Apply 1 patch topically 2 (two) times daily.     [provider]  methotrexate (RHEUMATREX) 2.5 MG tablet Take 7.5 mg by mouth every Monday. 04/30/19   [provider]  Multiple Vitamins-Iron (MULTIVITAMINS WITH IRON) TABS tablet Take 1 tablet by mouth daily.    [provider]  ondansetron (ZOFRAN) 4 MG tablet Take 4 mg by mouth every 4 (four) hours as needed for nausea or vomiting.    [provider]  oxycodone (OXY-IR) 5 MG capsule Take 10 mg by mouth every 6 (six) hours as needed for pain.    [provider]  pantoprazole (PROTONIX) 40 MG tablet Take 40 mg by mouth daily. 04/24/19   [provider]  Polyethyl Glycol-Propyl Glycol (SYSTANE) 0.4-0.3 % SOLN Place 1 drop into both eyes  2 (two) times daily.    [provider]  predniSONE (DELTASONE) 5 MG tablet Take 5 mg by mouth daily. 04/24/19   [provider]    Physical Exam: Vitals:   05/08/19 1000 05/08/19 1030 05/08/19 1100 05/08/19 1130  BP: 107/65 (!) 115/56 118/68 127/77  Pulse: 84 89 92   Resp: (!) 26 (!) 29 (!) 26 (!) 30  Temp:      TempSrc:      SpO2: 99%  93% 94%       General:  Appears ill   Eyes:  PERRL, EOMI, normal lids, iris  ENT:  grossly normal hearing, lips & tongue, mmm  Neck:  no LAD, masses or thyromegaly  Cardiovascular:  RRR, no m/r/g. No LE edema.   Respiratory:   Coarse rhonchi, audible from the bedside.  Mildly to moderately increased respiratory effort.  Abdomen:  soft, NT, ND, NABS  Skin:  no rash or induration seen on limited exam  Musculoskeletal:  grossly normal tone BUE/BLE, good ROM, no bony abnormality  Psychiatric:  grossly normal mood and affect, speech fluent and appropriate, AOx3; limited by conversational dyspnea  Neurologic:  CN 2-12 grossly intact, moves all extremities in coordinated fashion    Radiological Exams on Admission: Dg Chest Port 1 View  Result Date: 05/08/2019 CLINICAL DATA:  Shortness of breath, respiratory distress, COVID (+), EXAM: PORTABLE CHEST 1 VIEW COMPARISON:  Chest radiograph 05/04/2019 FINDINGS: Heart size within normal limits.  Aortic atherosclerosis. Hazy opacity within the left mid and upper lung field. Redemonstrated left basilar pleuroparenchymal scarring. Please note the patient's chin partially obscures the right lung apex. No evidence of pneumothorax. No acute bony abnormality. Cervical spinal fusion hardware. Overlying cardiac monitoring leads. IMPRESSION: Hazy opacity within the left mid and upper lung field, suspicious for pneumonia given provided history. Redemonstrated left basilar pleuroparenchymal scarring. Aortic atherosclerosis. Electronically Signed   By: Kellie Simmering DO   On: 05/08/2019 07:50   [place]  Ekg Site Rite  Result Date: 05/08/2019 If Site Rite image not attached, placement could not be confirmed due to current cardiac rhythm.   EKG: Independently reviewed.  NSR with rate 99; PVCs with no evidence of acute ischemia   Labs on Admission: I have personally reviewed the available labs and imaging studies at the time of the admission.  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[place])   Essential hypertension   Acute respiratory disease due to COVID-19 virus   COPD (chronic obstructive pulmonary disease) (HCC)   Atrial fibrillation, chronic (HCC)   Acute on chronic respiratory failure associated with COVID-19 infection in the setting of COPD on home O2 -Patient previously admitted to [facility] with d/c to home ALF yesterday after 5 days of treatment with steroids and remdesiivir -Returning after acute respiratory distress overnight -She is currently on 6L [place] O2 with improvement -COVID POSITIVE -The patient has comorbidities which may increase the risk for ARDS/MODS including: age, HTN, DM, COPD, immunosuppression (hold Methotrexate for now) -Exam is concerning for development of ARDS/MODS due to respiratory distress -Pertinent labs concerning for COVID include increased BUN/Creatinine; markedly elevated D-dimer (>1); markedly elevated CRP (>7); increased LDH; increased fibrinogen -CXR with multifocal opacities which appear to be c/w COVID progression -Will treat with  broad-spectrum antibiotics given procalcitonin >>0.1.   -Will readmit to [facility] for further evaluation, close monitoring, and treatment -Mildly elevated BNP so will give Lasix x 1 dose and monitor -Monitor on telemetry x at least 24 hours -At  this time, will attempt to avoid use of aerosolized medications and use HFAs instead -Will check daily labs including BMP with Mag, Phos; LFTs; CBC with differential; CRP; ferritin; fibrinogen; D-dimer -Will order repeat steroids (1 mg/kg divided BID); has already completed Remdesivir -If the patient shows clinical deterioration, consider transfer to ICU with PCCM consultation -If the patient is hypoxic or on >3L oxygen from baseline or CRP >7, considerTocilizumab 8 mg/kg x1 IF + COVID test; O2 sats <88% on room air; and patient is at high risk for intubation.  [facility] doctors regarding this medication since it is being used off label.  Patient d/w Dr. Candiss Norse. -Will attempt to maintain euvolemia to a net negative fluid status -Will ask the patient to maintain an awake prone position for 16+ hours a day, if possible, with a minimum of 2-3 hours at a time -With D-dimer <5, will use renally dosed Heparin for DVT prevention -Will obtain VQ scan but access has been a problem; nephrology (Dr. Augustin Coupe) approves use of PICC line in order to obtain access -Patient was seen wearing full PPE including: gown, gloves, head cover, N95, and face shield; donning and doffing was in compliance with current standards.  Stage IV CKD -Appears to be stable at this time, baseline creatinine 1.8-2.0 -While not ideal, due to access issues, Dr. Augustin Coupe has agreed that a PICC line seems to be reasonable at this time  DM -Recent A1c was 6.3 -She appears to be diet controlled -Given steroids, will order sensitive-scale SSI at this time  HTN -Continue Norvasc, labetalol  Afib -Rate controlled with Labetalol -No AC due to prior GI bleed (04/2018) -If VQ scan shows PE, will need  very careful initiation of heparin  HLD -Continue Lipitor    DVT prophylaxis:  Heparin Code Status:  DNR - confirmed with patient and her daughter Family Communication: None present; I spoke with the patient's daughter by telephone. Disposition Plan:  Home once clinically improved Consults called: None  Admission status: Admit - It is my clinical opinion that admission to INPATIENT is reasonable and necessary because of the expectation that this patient will require hospital care that crosses at least 2 midnights to treat this condition based on the medical complexity of the problems presented.  Given the aforementioned information, the predictability of an adverse outcome is felt to be significant.    Karmen Bongo MD [facility]   How to contact the [facility] Attending or Consulting provider [place] or covering provider during after hours [place], for this patient?  1. Check the care team in [facility] and look for a) attending/consulting TRH provider listed and b) the [facility] team listed 2. Log into [email or web address] and use [place]'s universal password to access. If you do not have the password, please contact the hospital operator. 3. Locate the [facility] provider you are looking for under [facility] and page to a number that you can be directly reached. 4. If you still have difficulty reaching the provider, please page the [facility] (Director on Call) for the Hospitalists listed on amion for assistance.   05/08/2019, 6:14 PM

## 2019-05-08 NOTE — ED Notes (Addendum)
IV team called to see verification. They said someone would be coming soon.

## 2019-05-08 NOTE — Progress Notes (Signed)
[facility] RN contacted by PICC RN; suggested midline be placed tonight as pt needs Zithromax, Rocephin, Lasix, and Solu-medrol. Pt has limited venous access. If patient still needs PICC tomorrow it can be placed at that time. Notified pt's RN in ER of plan for midline placement this evening.

## 2019-05-08 NOTE — ED Provider Notes (Signed)
Medical screening examination/treatment/procedure(s) were conducted as a shared visit with non-physician practitioner(s) and myself.  I personally evaluated the patient during the encounter.         This is a 78 year old female who presents in respiratory distress.  She was recently diagnosed with COVID-19.  She spent approximately 5 days at [facility].  She received a 5-day course of remdesivir.  On admission she had an increased oxygen requirement.  At baseline she is on 2 L of nasal cannula for chronic respiratory repair failure and COPD.  Patient returned to her living facility yesterday afternoon.  She was noted to be hypoxic this morning and altered.  She was placed on a nonrebreather by EMS.  On my evaluation she is altered.  She is unable to give me any history.  Only oriented x1.  She has diminished breath sounds in all lung fields.  No obvious wheezing.  No obvious volume overload.  She is acutely delirious.  I have requested stat ABG.  Had a long conversation with the patient's daughter, [name].  Patient's most recent CODE STATUS was DNR.  However, [name] states that she believes her mother would want to be intubated if she had a reversible or treatable cause.  I discussed with her at length that if her mother required intubation, she would likely be very difficult to wean from the ventilator but at this time I could not give her any prognostics as we are still in the work-up phase.  She wishes her to be intubated if needed but no chest compressions or advanced ACLS if the patient's heart stops.  Physical Exam  BP (!) 124/94   Pulse 100   Temp 100.2 F (37.9 C) (Oral)   Resp (!) 25   SpO2 99%   Physical Exam Ill-appearing, delirious Tachypnea with increased work of breathing, nonrebreather in place, poor air movement in all lung fields No significant peripheral edema ED Course/Procedures     Procedures CRITICAL CARE Performed by: Merryl Hacker   Total critical care time:  31 minutes  Critical care time was exclusive of separately billable procedures and treating other patients.  Critical care was necessary to treat or prevent imminent or life-threatening deterioration.  Critical care was time spent personally by me on the following activities: development of treatment plan with patient and/or surrogate as well as nursing, discussions with consultants, evaluation of patient's response to treatment, examination of patient, obtaining history from patient or surrogate, ordering and performing treatments and interventions, ordering and review of laboratory studies, ordering and review of radiographic studies, pulse oximetry and re-evaluation of patient's condition.  MDM   Presents with hypoxia and respiratory distress.  Discharged yesterday from Covid unit.  She is ill-appearing on exam and hypoxic.  Somewhat altered.  Will repeat Covid lab work.  Stat chest x-ray and ABG.  7:50 AM On reassessment by PA, patient is now mentating better.  Question whether she had appropriate oxygen at her facility.  We will continue work-up and sign out to Dr. Tyrone Nine.       Merryl Hacker, MD 05/08/19 [phone]

## 2019-05-08 NOTE — ED Notes (Signed)
Attempted to Call Report to [facility]

## 2019-05-08 NOTE — Progress Notes (Signed)
[facility] RN contact by PICC RN. Informed that current PICC nurse is on another campus and there will not be a PICC nurse available tonight. Writer informed Thomasenia Bottoms, MD; Lorin Mercy, MD confirmed she spoke with nephrology and they verbalized permission to place midline or PICC.

## 2019-05-08 NOTE — Progress Notes (Signed)
[facility] consulted regarding PICC placement after IV access failed x2. Pt will be transferred to [facility] after VQ scan and line placement. Writer spoke with pt's nurse and Thomasenia Bottoms, MD. Informed that typically if CC <30, a PICC and midline will not be placed unless approved by nephrology. Lorin Mercy, MD contacting nephrology; will follow-up.

## 2019-05-08 NOTE — ED Provider Notes (Addendum)
[facility] EMERGENCY DEPARTMENT Provider Note   CSN: [id number] Arrival date & time: 05/08/19  [id number]     History   Chief Complaint Chief Complaint  Patient presents with  . Respiratory Distress    HPI Joyce Ortiz is a 78 y.o. female history of CHF, COPD, A. fib and stroke recently diagnosed with Covid hospitalized 12/3-12/8      Shortness of Breath Severity:  Severe Onset quality:  Unable to specify Timing:  Constant Progression:  Worsening Chronicity:  New Relieved by:  None tried Worsened by:  Exertion and activity Ineffective treatments:  Position changes, oxygen and sitting up Associated symptoms: no fever   Risk factors: no hx of PE/DVT     Patient presents today from assisted living facility where she was found this morning to be short of breath and saturating 80% on her home 2 L and improved only to 90% at 4 L.  Was placed on nonrebreather by EMS.   Dr. Dina Rich discussed with Mena Pauls daughter to confirm code status.  - DNR but amenable to intubation for respiratory support    Discussed with Danielle from assisted living facility states patient was on her usual home oxygen after discharge from hospital.  States there is no discontinuity here.  Andee Poles states that patient was mentating well and appeared much improved after hospital stay that her worsening this morning was notable significant.   On my review of EMR: Patient was recently discharged from hospital 12/8 after being weaned to 2 L nasal cannula O2.  She received 5 days of remdesivir.  Had negative blood cultures.  Clinically improved hospitalization had vital signs grossly within normal limits at time of discharge.  All the time of discharge was 54, temperature afebrile, respiratory rate 18, weight 67.8.  Level 5 caveat due to mental status and acuity  Past Medical History:  Diagnosis Date  . Atrial fibrillation ([place])   . CHF (congestive heart failure) ([place])   . COPD (chronic  obstructive pulmonary disease) ([place])   . Diabetes mellitus type 2, controlled ([place])   . Hypertension   . Renal disorder   . Respiratory failure, chronic ([place])   . Stroke [facility])     Patient Active Problem List   Diagnosis Date Noted  . COVID-19 virus infection 05/02/2019  . DNR (do not resuscitate) 05/02/2019  . GERD (gastroesophageal reflux disease) 05/02/2019  . Thrombocytopenia ([place]) 05/02/2019  . CKD (chronic kidney disease), stage IV ([place]) 05/02/2019  . History of CHF (congestive heart failure) 05/02/2019  . 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 Essential hypertension 05/02/2019    No past surgical history on file.   OB History   No obstetric history on file.      Home Medications    Prior to Admission medications   Medication Sig Start Date End Date Taking? Authorizing Provider  acetaminophen (TYLENOL) 325 MG tablet Take 650 mg by mouth every 4 (four) hours as needed for mild pain.    [provider]  amLODipine (NORVASC) 10 MG tablet Take 10 mg by mouth daily. 04/24/19   [provider]  atorvastatin (LIPITOR) 40 MG tablet Take 40 mg by mouth daily. 04/24/19   [provider]  bisacodyl (DULCOLAX) 5 MG EC tablet Take 5 mg by mouth daily as needed for moderate constipation.    [provider]  brimonidine (ALPHAGAN P) 0.1 % SOLN Place 1 drop into both eyes 3 (three) times daily.  [provider]  DULoxetine (CYMBALTA) 60 MG capsule Take 60 mg by mouth daily. 04/24/19   [provider]  escitalopram (LEXAPRO) 5 MG tablet Take 5 mg by mouth daily. 04/24/19   [provider]  folic acid (FOLVITE) 1 MG tablet Take 1 mg by mouth daily.    [provider]  furosemide (LASIX) 40 MG tablet Take 40 mg by mouth daily. 04/24/19   [provider]  gabapentin (NEURONTIN) 100 MG capsule Take 100 mg by mouth 2 (two) times daily. 05/01/19   [provider]   ipratropium-albuterol (DUONEB) 0.5-2.5 (3) MG/3ML SOLN Take 3 mLs by nebulization every 6 (six) hours as needed (wheezing/SOB).    [provider]  labetalol (NORMODYNE) 100 MG tablet Take 0.5 tablets (50 mg total) by mouth 2 (two) times daily. 05/07/19   Cherene Altes, MD  latanoprost (XALATAN) 0.005 % ophthalmic solution Place 1 drop into both eyes at bedtime. 04/24/19   [provider]  Menthol (ICY HOT) 5 % PTCH Apply 1 patch topically 2 (two) times daily.     [provider]  methotrexate (RHEUMATREX) 2.5 MG tablet Take 7.5 mg by mouth every Monday. 04/30/19   [provider]  Multiple Vitamins-Iron (MULTIVITAMINS WITH IRON) TABS tablet Take 1 tablet by mouth daily.    [provider]  ondansetron (ZOFRAN) 4 MG tablet Take 4 mg by mouth every 4 (four) hours as needed for nausea or vomiting.    [provider]  oxycodone (OXY-IR) 5 MG capsule Take 10 mg by mouth every 6 (six) hours as needed for pain.    [provider]  pantoprazole (PROTONIX) 40 MG tablet Take 40 mg by mouth daily. 04/24/19   [provider]  Polyethyl Glycol-Propyl Glycol (SYSTANE) 0.4-0.3 % SOLN Place 1 drop into both eyes 2 (two) times daily.    [provider]  predniSONE (DELTASONE) 5 MG tablet Take 5 mg by mouth daily. 04/24/19   [provider]    Family History Family History  Problem Relation Age of Onset  . Cancer Father     Social History Social History   Tobacco Use  . Smoking status: Former Smoker  Substance Use Topics  . Alcohol use: Not on file  . Drug use: Not on file     Allergies   Aspirin, Biaxin [clarithromycin], Codeine, and Moxifloxacin   Review of Systems Review of Systems  Unable to perform ROS: Mental status change  Constitutional: Negative for fever.  Respiratory: Positive for shortness of breath.      Physical Exam Updated Vital Signs BP (!) 120/99   Pulse 81   Temp 100.2 F  (37.9 C) (Oral)   Resp (!) 28   SpO2 100%   Physical Exam Vitals signs and nursing note reviewed.  Constitutional:      General: She is in acute distress.     Appearance: She is ill-appearing.     Comments: Appears acutely distressed, short of breath, unable to answer questions, altered mental status  HENT:     Head: Normocephalic and atraumatic.     Nose: Nose normal.     Mouth/Throat:     Mouth: Mucous membranes are dry.  Eyes:     General: No scleral icterus. Neck:     Musculoskeletal: Normal range of motion.  Cardiovascular:     Rate and Rhythm: Normal rate and regular rhythm.     Pulses: Normal pulses.     Heart sounds: Normal heart  sounds.  Pulmonary:     Effort: No respiratory distress.     Breath sounds: No wheezing.     Comments: Decreased air movement bilateral lungs.  Diffuse wheezing throughout.  Increased work of breathing, tachypnic  Abdominal:     Palpations: Abdomen is soft.     Tenderness: There is no abdominal tenderness.  Musculoskeletal:     Right lower leg: No edema.     Left lower leg: No edema.  Skin:    General: Skin is warm and dry.     Capillary Refill: Capillary refill takes less than 2 seconds.  Neurological:     Mental Status: She is alert. Mental status is at baseline.     Comments: Oriented to self  Psychiatric:        Mood and Affect: Mood normal.        Behavior: Behavior normal.      ED Treatments / Results  Labs (all labs ordered are listed, but only abnormal results are displayed) Labs Reviewed  BASIC METABOLIC PANEL - Abnormal; Notable for the following components:      Result Value   BUN 50 (*)    Creatinine, Ser 1.94 (*)    Calcium 8.6 (*)    GFR calc non Af Amer 24 (*)    GFR calc Af Amer 28 (*)    Anion gap 16 (*)    All other components within normal limits  CBC WITH DIFFERENTIAL/PLATELET - Abnormal; Notable for the following components:   WBC 30.4 (*)    RDW 16.9 (*)    All other components within normal limits   BRAIN NATRIURETIC PEPTIDE - Abnormal; Notable for the following components:   B Natriuretic Peptide 262.6 (*)    All other components within normal limits  D-DIMER, QUANTITATIVE (NOT AT [facility]) - Abnormal; Notable for the following components:   D-Dimer, Quant 2.43 (*)    All other components within normal limits  LACTATE DEHYDROGENASE - Abnormal; Notable for the following components:   LDH 336 (*)    All other components within normal limits  TRIGLYCERIDES - Abnormal; Notable for the following components:   Triglycerides 159 (*)    All other components within normal limits  FIBRINOGEN - Abnormal; Notable for the following components:   Fibrinogen 590 (*)    All other components within normal limits  C-REACTIVE PROTEIN - Abnormal; Notable for the following components:   CRP 8.4 (*)    All other components within normal limits  POCT I-STAT 7, (LYTES, BLD GAS, ICA,H+H) - Abnormal; Notable for the following components:   pCO2 arterial 50.0 (*)    pO2, Arterial 131.0 (*)    Bicarbonate 29.2 (*)    Acid-Base Excess 3.0 (*)    Potassium 3.4 (*)    All other components within normal limits  CULTURE, BLOOD (ROUTINE X 2)  CULTURE, BLOOD (ROUTINE X 2)  LACTIC ACID, PLASMA  PROCALCITONIN  FERRITIN  URINALYSIS, ROUTINE W REFLEX MICROSCOPIC  LACTIC ACID, PLASMA  I-STAT ARTERIAL BLOOD GAS, ED    EKG EKG Interpretation  Date/Time:  Wednesday May 08 2019 06:43:45 EST Ventricular Rate:  99 PR Interval:    QRS Duration: 98 QT Interval:  356 QTC Calculation: 441 R Axis:   76 Text Interpretation: Sinus rhythm Paired ventricular premature complexes Otherwise no significant change Confirmed by Deno Etienne [phone]) on 05/08/2019 8:28:55 AM   Radiology Dg Chest Port 1 View  Result Date: 05/08/2019 CLINICAL DATA:  Shortness of breath, respiratory distress, COVID (+),  EXAM: PORTABLE CHEST 1 VIEW COMPARISON:  Chest radiograph 05/04/2019 FINDINGS: Heart size within normal limits.  Aortic  atherosclerosis. Hazy opacity within the left mid and upper lung field. Redemonstrated left basilar pleuroparenchymal scarring. Please note the patient's chin partially obscures the right lung apex. No evidence of pneumothorax. No acute bony abnormality. Cervical spinal fusion hardware. Overlying cardiac monitoring leads. IMPRESSION: Hazy opacity within the left mid and upper lung field, suspicious for pneumonia given provided history. Redemonstrated left basilar pleuroparenchymal scarring. Aortic atherosclerosis. Electronically Signed   By: Kellie Simmering DO   On: 05/08/2019 07:50    Procedures Procedures (including critical care time)  Medications Ordered in ED Medications  albuterol (VENTOLIN HFA) 108 (90 Base) MCG/ACT inhaler 8 puff (8 puffs Inhalation Given 05/08/19 0810)  ipratropium (ATROVENT HFA) inhaler 2 puff (2 puffs Inhalation Given 05/08/19 0813)  dexamethasone (DECADRON) injection 10 mg (10 mg Intravenous Given 05/08/19 0815)     Initial Impression / Assessment and Plan / ED Course  I have reviewed the triage vital signs and the nursing notes.  Pertinent labs & imaging results that were available during my care of the patient were reviewed by me and considered in my medical decision making (see chart for details).        Patient presents to ED with increased work of breathing, altered mental status, history of COPD, CHF, recent Covid positive.  Was discharged from hospital yesterday suspect that this is relapse worsening of Covid.  However this could be a COPD exacerbation.   Patient with improvement on reevaluation of mental status.  Patient was able to be weaned down to 2 L nasal cannula.  However inflammatory markers are markedly increased from discharge and she has increased work of breathing, tachypnea and worsening chest x-ray.  Concern for pulmonary embolism due to patient's hypoxia and increased work of breathing.  Due to patient has increased creatinine unable to order  CTA of chest.  Radiology recommends a CT scan as they are unable to use low-dose CTA because of elevated creatinine.  Patient's creatinine is at baseline for her as she has CKD 4.  At 10am:  Patient mental status dramatically improved.  Able to discuss more thoroughly her symptoms.  Patient states that she went home from hospital feeling improved, however this morning she started feeling short of breath.  She was not improved when nasal cannula was increased.  EMS was called and she was transferred to ED.  Patient states that she was on her home oxygen when she was feeling short of breath.  Daughter had stated previously that she was concerned that facility had not put patient back on her home oxygen. -Discussed with Danielle (PCS cordinator at [facility] assisted [facility]) the patient is being readmitted.  Andee Poles states that [facility] is able to have patients with oxygen requirements up to 6 L.   Discussed at 11:15 AM with Dr. Lorin Mercy who will take over patient care at this time.  VQ scan pending.   TANEHA VALENTINI was evaluated in Emergency Department on 05/08/2019 for the symptoms described in the history of present illness. She was evaluated in the context of the global COVID-19 pandemic, which necessitated consideration that the patient might be at risk for infection with the SARS-CoV-2 virus that causes COVID-19. Institutional protocols and algorithms that pertain to the evaluation of patients at risk for COVID-19 are in a state of rapid change based on information released by regulatory bodies including the CDC and federal and state organizations. These  policies and algorithms were followed during the patient's care in the ED.   Final Clinical Impressions(s) / ED Diagnoses   Final diagnoses:  Respiratory distress  COVID-19    ED Discharge Orders    None       Tedd Sias, [place] 05/08/19 1119    Deno Etienne, DO 05/08/19 [address] [place], [place] 05/08/19 1529    Pati Gallo [place], [place] 05/08/19 1706    Merryl Hacker, MD 05/13/19 [phone]

## 2019-05-08 NOTE — ED Notes (Signed)
Update given to facility by PA.  Orders to admit.  Pt restful without acute needs VS as noted.  Maintaining saturation on [place].

## 2019-05-08 NOTE — ED Notes (Signed)
Call the daughter at [phone]

## 2019-05-08 NOTE — ED Notes (Signed)
Pt now c/o pain to R leg with inability to hold still in bed.  Still waiting for iv team to place picc line.

## 2019-05-08 NOTE — Progress Notes (Signed)
Pt assessed and ABG drawn. Pt's HR 96, RR 26-30, SATs 98-100% on 15L NRB in semi fowlers position. Pt is seemingly lethargic but arousable to painful stimuli and/or voice. Pt has a strong/congested cough. LBS were clear/diminished. RBS fine crackles were heard and also diminished. When asked if she knew where she was pt stated she was at [facility]. RN explained to pt that she was in the hospital. Pt placed on 7L HFNC with sterile water with SATs maintaining high 90s. RT is available for any further need.   Results for RAHI, UMHOLTZ (MRN [id number]) as of 05/08/2019 07:41  Ref. Range 05/08/2019 07:25  Sample type Unknown ARTERIAL  pH, Arterial Latest Ref Range: 7.350 - 7.450  7.378  pCO2 arterial Latest Ref Range: 32.0 - 48.0 mmHg 50.0 (H)  pO2, Arterial Latest Ref Range: 83.0 - 108.0 mmHg 131.0 (H)  TCO2 Latest Ref Range: 22 - 32 mmol/L 31  Acid-Base Excess Latest Ref Range: 0.0 - 2.0 mmol/L 3.0 (H)  Bicarbonate Latest Ref Range: 20.0 - 28.0 mmol/L 29.2 (H)  O2 Saturation Latest Units: % 99.0  Patient temperature Unknown 100.2 F  Sodium Latest Ref Range: 135 - 145 mmol/L 139  Potassium Latest Ref Range: 3.5 - 5.1 mmol/L 3.4 (L)  Calcium Ionized Latest Ref Range: 1.15 - 1.40 mmol/L 1.21  Hemoglobin Latest Ref Range: 12.0 - 15.0 g/dL 13.3  HCT Latest Ref Range: 36.0 - 46.0 % 39.0

## 2019-05-08 NOTE — ED Notes (Signed)
Pt taken to VQ scan and IV not patent at this time.  Will place IV team order.

## 2019-05-09 ENCOUNTER — Inpatient Hospital Stay (HOSPITAL_COMMUNITY): Payer: Medicare Other

## 2019-05-09 LAB — CBC WITH DIFFERENTIAL/PLATELET
Abs Immature Granulocytes: 0.3 10*3/uL — ABNORMAL HIGH (ref 0.00–0.07)
Band Neutrophils: 25 %
Basophils Absolute: 0 10*3/uL (ref 0.0–0.1)
Basophils Relative: 0 %
Eosinophils Absolute: 0 10*3/uL (ref 0.0–0.5)
Eosinophils Relative: 0 %
HCT: 35.5 % — ABNORMAL LOW (ref 36.0–46.0)
Hemoglobin: 11.9 g/dL — ABNORMAL LOW (ref 12.0–15.0)
Lymphocytes Relative: 2 %
Lymphs Abs: 0.6 10*3/uL — ABNORMAL LOW (ref 0.7–4.0)
MCH: 32.2 pg (ref 26.0–34.0)
MCHC: 33.5 g/dL (ref 30.0–36.0)
MCV: 96.2 fL (ref 80.0–100.0)
Monocytes Absolute: 0.6 10*3/uL (ref 0.1–1.0)
Monocytes Relative: 2 %
Myelocytes: 1 %
Neutro Abs: 30.5 10*3/uL — ABNORMAL HIGH (ref 1.7–7.7)
Neutrophils Relative %: 70 %
Platelets: 223 10*3/uL (ref 150–400)
RBC: 3.69 MIL/uL — ABNORMAL LOW (ref 3.87–5.11)
RDW: 17.1 % — ABNORMAL HIGH (ref 11.5–15.5)
WBC Morphology: INCREASED
WBC: 32.1 10*3/uL — ABNORMAL HIGH (ref 4.0–10.5)
nRBC: 0 % (ref 0.0–0.2)

## 2019-05-09 LAB — GLUCOSE, CAPILLARY
Glucose-Capillary: 183 mg/dL — ABNORMAL HIGH (ref 70–99)
Glucose-Capillary: 223 mg/dL — ABNORMAL HIGH (ref 70–99)
Glucose-Capillary: 173 mg/dL — ABNORMAL HIGH (ref 70–99)
Glucose-Capillary: 233 mg/dL — ABNORMAL HIGH (ref 70–99)

## 2019-05-09 LAB — MAGNESIUM: Magnesium: 1.7 mg/dL (ref 1.7–2.4)

## 2019-05-09 LAB — COMPREHENSIVE METABOLIC PANEL
ALT: 30 U/L (ref 0–44)
AST: 19 U/L (ref 15–41)
Albumin: 2.6 g/dL — ABNORMAL LOW (ref 3.5–5.0)
Alkaline Phosphatase: 52 U/L (ref 38–126)
Anion gap: 16 — ABNORMAL HIGH (ref 5–15)
BUN: 56 mg/dL — ABNORMAL HIGH (ref 8–23)
CO2: 28 mmol/L (ref 22–32)
Calcium: 8.3 mg/dL — ABNORMAL LOW (ref 8.9–10.3)
Chloride: 98 mmol/L (ref 98–111)
Creatinine, Ser: 2.21 mg/dL — ABNORMAL HIGH (ref 0.44–1.00)
GFR calc Af Amer: 24 mL/min — ABNORMAL LOW (ref >60)
GFR calc non Af Amer: 21 mL/min — ABNORMAL LOW (ref >60)
Glucose, Bld: 191 mg/dL — ABNORMAL HIGH (ref 70–99)
Potassium: 3.8 mmol/L (ref 3.5–5.1)
Sodium: 142 mmol/L (ref 135–145)
Total Bilirubin: 0.6 mg/dL (ref 0.3–1.2)
Total Protein: 6 g/dL — ABNORMAL LOW (ref 6.5–8.1)

## 2019-05-09 LAB — C-REACTIVE PROTEIN: CRP: 27.2 mg/dL — ABNORMAL HIGH (ref <1.0)

## 2019-05-09 LAB — D-DIMER, QUANTITATIVE: D-Dimer, Quant: 1.69 ug/mL-FEU — ABNORMAL HIGH (ref 0.00–0.50)

## 2019-05-09 LAB — PHOSPHORUS: Phosphorus: 4.1 mg/dL (ref 2.5–4.6)

## 2019-05-09 LAB — PROCALCITONIN: Procalcitonin: 30.52 ng/mL

## 2019-05-09 LAB — MRSA PCR SCREENING: MRSA by PCR: POSITIVE — AB

## 2019-05-09 LAB — FERRITIN: Ferritin: 183 ng/mL (ref 11–307)

## 2019-05-09 MED ORDER — METHYLPREDNISOLONE SODIUM SUCC 40 MG IJ SOLR
40.0000 mg | Freq: Every day | INTRAMUSCULAR | Status: DC
  Administered 2019-05-10: 10:00:00 40 mg via INTRAVENOUS
  Filled 2019-05-09: qty 1

## 2019-05-09 MED ORDER — OXYCODONE HCL 5 MG PO TABS
5.0000 mg | ORAL_TABLET | Freq: Three times a day (TID) | ORAL | Status: DC | PRN
  Administered 2019-05-09 (×2): 5 mg via ORAL
  Filled 2019-05-09 (×2): qty 1

## 2019-05-09 MED ORDER — METHYLPREDNISOLONE SODIUM SUCC 125 MG IJ SOLR: 60.0000 mg | Freq: Every day | INTRAMUSCULAR | Status: DC

## 2019-05-09 MED ORDER — SODIUM CHLORIDE 0.9 % IV SOLN
1.5000 g | Freq: Two times a day (BID) | INTRAVENOUS | Status: DC
  Administered 2019-05-09 – 2019-05-13 (×8): 1.5 g via INTRAVENOUS
  Filled 2019-05-09: qty 1.5
  Filled 2019-05-09 (×7): qty 4

## 2019-05-09 MED ORDER — FUROSEMIDE 10 MG/ML IJ SOLN: 60.0000 mg | Freq: Once | INTRAMUSCULAR | Status: DC

## 2019-05-09 MED ORDER — LIP MEDEX EX OINT
TOPICAL_OINTMENT | CUTANEOUS | Status: DC | PRN
  Filled 2019-05-09: qty 7

## 2019-05-09 MED ORDER — HEPARIN SODIUM (PORCINE) 10000 UNIT/ML IJ SOLN
7500.0000 [IU] | Freq: Three times a day (TID) | INTRAMUSCULAR | Status: DC
  Administered 2019-05-09 – 2019-05-10 (×3): 7500 [IU] via SUBCUTANEOUS
  Filled 2019-05-09 (×3): qty 1

## 2019-05-09 MED ORDER — DOXYCYCLINE HYCLATE 100 MG PO TABS
100.0000 mg | ORAL_TABLET | Freq: Two times a day (BID) | ORAL | Status: DC
  Administered 2019-05-09 – 2019-05-13 (×9): 100 mg via ORAL
  Filled 2019-05-09 (×10): qty 1

## 2019-05-09 MED ORDER — FUROSEMIDE 10 MG/ML IJ SOLN
40.0000 mg | Freq: Once | INTRAMUSCULAR | Status: AC
  Administered 2019-05-09: 11:00:00 40 mg via INTRAVENOUS
  Filled 2019-05-09: qty 4

## 2019-05-09 NOTE — Progress Notes (Signed)
Spoke to floor RN, PICC not needed anymore patient has a Midline placed.

## 2019-05-09 NOTE — Evaluation (Signed)
Physical Therapy Evaluation Patient Details Name: Joyce Ortiz MRN: [id number] DOB: 1940/10/21 Today's Date: 05/09/2019   History of Present Illness  Pt is a 78 y.o. female recently admitted to [facility] 05/02/19-05/07/19 with d/c back to ALF, now readmitted 05/08/19 with respiratory distress and AMS; worked up for CHF and PNA. PMH includes CVA (residual R-side deficits), CKD IV, HTN, DM, COPD, CHF, dysphagia, afib.    Clinical Impression  Pt presents with an overall decrease in functional mobility secondary to above. PTA, pt resides at ALF, staff assists with transfer to w/c and ADL tasks; pt recently admitted to [facility] 12/3-12/8. Today, pt requiring modA for mobility; limited by significant fatigue and SOB. Able to call and speak with family at end of session for update. Pt would benefit from continued acute PT services to maximize functional mobility and independence prior to return to ALF.     Follow Up Recommendations Other (comment)(return to ALF - HHPT services if an option)    Equipment Recommendations  None recommended by PT    Recommendations for Other Services       Precautions / Restrictions Precautions Precautions: Fall Restrictions Weight Bearing Restrictions: No      Mobility  Bed Mobility Overal bed mobility: Needs Assistance Bed Mobility: Supine to Sit     Supine to sit: Supervision;HOB elevated     General bed mobility comments: Increased time and effort, able to come to long sitting then scoot to EOB with use of rail  Transfers Overall transfer level: Needs assistance Equipment used: 1 person hand held assist Transfers: Sit to/from Stand Sit to Stand: Mod assist Stand pivot transfers: Mod assist       General transfer comment: HHA and modA for trunk elevation and to maintain balance; pt insistent on holding ice cup while transfering having difficulty without ability to use 2nd hand for support; max cues for sequencing with pt saying, "I forgot  how!"  Ambulation/Gait                Stairs            Wheelchair Mobility    Modified Rankin (Stroke Patients Only)       Balance Overall balance assessment: Needs assistance Sitting-balance support: Feet supported;No upper extremity supported Sitting balance-Leahy Scale: Fair     Standing balance support: During functional activity;Single extremity supported Standing balance-Leahy Scale: Poor Standing balance comment: Reliant on UE support and external assist                             Pertinent Vitals/Pain Pain Assessment: Faces Faces Pain Scale: Hurts even more Pain Location: LLE Pain Descriptors / Indicators: Moaning;Discomfort Pain Intervention(s): Monitored during session;Patient requesting pain meds-RN notified    Home Living Family/patient expects to be discharged to:: Assisted living               Home Equipment: Walker - 4 wheels;Bedside commode;Shower seat;Wheelchair - manual      Prior Function Level of Independence: Needs assistance   Gait / Transfers Assistance Needed: pt. reports does not walk because they won't let me" Gets around in w/c  ADL's / Homemaking Assistance Needed: requires assisance for toileting        Hand Dominance        Extremity/Trunk Assessment   Upper Extremity Assessment Upper Extremity Assessment: Defer to OT evaluation    Lower Extremity Assessment Lower Extremity Assessment: Generalized weakness RLE Deficits / Details: Functionally  at least 3/5 throughout, good ROM LLE Deficits / Details: Functionally at least 3/5 throughout, good ROM    Cervical / Trunk Assessment Cervical / Trunk Assessment: Kyphotic  Communication      Cognition Arousal/Alertness: Awake/alert Behavior During Therapy: WFL for tasks assessed/performed Overall Cognitive Status: No family/caregiver present to determine baseline cognitive functioning                                 General  Comments: generally oriented to situation, follows commands; difficult to reason with      General Comments General comments (skin integrity, edema, etc.): Once supine, able to call family and speak with daughter [name]) and granddaughter, pt appreciative    Exercises     Assessment/Plan    PT Assessment Patient needs continued PT services  PT Problem List Decreased strength;Decreased mobility;Decreased activity tolerance;Decreased balance;Decreased knowledge of use of DME;Cardiopulmonary status limiting activity       PT Treatment Interventions Therapeutic activities;Therapeutic exercise;Patient/family education;Functional mobility training;Wheelchair mobility training;DME instruction;Gait training;Balance training    PT Goals (Current goals can be found in the Care Plan section)  Acute Rehab PT Goals Patient Stated Goal: "I was hoping to make it to Christmas" PT Goal Formulation: With patient Time For Goal Achievement: 05/23/19 Potential to Achieve Goals: Fair    Frequency Min 2X/week   Barriers to discharge        Co-evaluation               AM-PAC PT "6 Clicks" Mobility  Outcome Measure Help needed turning from your back to your side while in a flat bed without using bedrails?: None Help needed moving from lying on your back to sitting on the side of a flat bed without using bedrails?: A Little Help needed moving to and from a bed to a chair (including a wheelchair)?: A Lot Help needed standing up from a chair using your arms (e.g., wheelchair or bedside chair)?: A Lot Help needed to walk in hospital room?: A Lot Help needed climbing 3-5 steps with a railing? : Total 6 Click Score: 14    End of Session Equipment Utilized During Treatment: Oxygen Activity Tolerance: Patient tolerated treatment well;Patient limited by fatigue Patient left: in chair;with call bell/phone within reach(chair alarm under pt but no box, RN aware and to find one) Nurse Communication:  Mobility status PT Visit Diagnosis: Unsteadiness on feet (R26.81);Other abnormalities of gait and mobility (R26.89)    Time: [id number] PT Time Calculation (min) (ACUTE ONLY): 42 min   Charges:   PT Evaluation $PT Eval Moderate Complexity: 1 Mod PT Treatments $Therapeutic Activity: 23-37 mins       Mabeline Caras, PT, DPT Acute Rehabilitation Services  Pager [phone] Office [place] 05/09/2019, 12:12 PM

## 2019-05-09 NOTE — Plan of Care (Signed)
  Problem: Education: Goal: Knowledge of General Education information will improve Description: Including pain rating scale, medication(s)/side effects and non-pharmacologic comfort measures Outcome: Progressing   Problem: Health Behavior/Discharge Planning: Goal: Ability to manage health-related needs will improve Outcome: Progressing   Problem: Clinical Measurements: Goal: Ability to maintain clinical measurements within normal limits will improve Outcome: Progressing Goal: Will remain free from infection Outcome: Progressing Goal: Diagnostic test results will improve Outcome: Progressing Goal: Respiratory complications will improve Outcome: Progressing Goal: Cardiovascular complication will be avoided Outcome: Progressing   Problem: Activity: Goal: Risk for activity intolerance will decrease Outcome: Progressing   Problem: Nutrition: Goal: Adequate nutrition will be maintained Outcome: Progressing   Problem: Coping: Goal: Level of anxiety will decrease Outcome: Progressing   Problem: Elimination: Goal: Will not experience complications related to bowel motility Outcome: Progressing Goal: Will not experience complications related to urinary retention Outcome: Progressing   Problem: Pain Managment: Goal: General experience of comfort will improve Outcome: Progressing   Problem: Safety: Goal: Ability to remain free from injury will improve Outcome: Progressing   Problem: Skin Integrity: Goal: Risk for impaired skin integrity will decrease Outcome: Progressing   Problem: Education: Goal: Knowledge of risk factors and measures for prevention of condition will improve Outcome: Progressing   Problem: Coping: Goal: Psychosocial and spiritual needs will be supported Outcome: Progressing   Problem: Respiratory: Goal: Will maintain a patent airway Outcome: Progressing Goal: Complications related to the disease process, condition or treatment will be avoided or  minimized Outcome: Progressing   Problem: Education: Goal: Ability to describe self-care measures that may prevent or decrease complications (Diabetes Survival Skills Education) will improve Outcome: Progressing Goal: Individualized Educational Video(s) Outcome: Progressing   Problem: Coping: Goal: Ability to adjust to condition or change in health will improve Outcome: Progressing   Problem: Fluid Volume: Goal: Ability to maintain a balanced intake and output will improve Outcome: Progressing   Problem: Health Behavior/Discharge Planning: Goal: Ability to identify and utilize available resources and services will improve Outcome: Progressing Goal: Ability to manage health-related needs will improve Outcome: Progressing   Problem: Metabolic: Goal: Ability to maintain appropriate glucose levels will improve Outcome: Progressing   Problem: Nutritional: Goal: Maintenance of adequate nutrition will improve Outcome: Progressing Goal: Progress toward achieving an optimal weight will improve Outcome: Progressing   Problem: Skin Integrity: Goal: Risk for impaired skin integrity will decrease Outcome: Progressing   Problem: Tissue Perfusion: Goal: Adequacy of tissue perfusion will improve Outcome: Progressing   

## 2019-05-09 NOTE — CV Procedure (Signed)
Echocardiogram not completed per patients request, she does not fell like this exam is necessary.  Darlina Sicilian RDCS

## 2019-05-09 NOTE — Progress Notes (Addendum)
PROGRESS NOTE                                                                                                                                                                                                             Patient Demographics:    Joyce Ortiz, is a 78 y.o. female, DOB - 1941-04-27, [id number]  Outpatient Primary MD for the patient is Bonnita Nasuti, MD    LOS - 1  Admit date - 05/08/2019    Chief Complaint  Patient presents with  . Respiratory Distress       Brief Narrative   Joyce Ortiz is a 78 y.o. female with medical history significant of CVA; stage IV CKD; HTN; DM; COPD; CHF (uncertain systolic vs. Diastolic); chronic debility and dysphagia (has refused SLP evaluation or modification of diet); and afib (off Eliquis due to GI bleed in 04/2018) presenting with respiratory distress.  She was previously admitted at [facility] from 12/3-8 with  COVID-19-associated PNA, she was readmitted with hypoxia most likely due to superimposed bacterial pneumonia and CHF.   Subjective:    Joyce Ortiz today has, No headache, No chest pain, No abdominal pain - No Nausea, No new weakness tingling or numbness, ++Cough and mild SOB.    Assessment  & Plan :     1. 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  She has profound leukocytosis, elevated procalcitonin along with high proBNP.  She has been placed on appropriate antibiotics, will also check MRSA PCR and blood cultures.  IV Lasix.  Monitor closely.  Note she uses 2 L nasal cannula at baseline.  I suspect that she is intermittently aspirating under the effect of high-dose narcotics which she takes at home.  Encouraged the patient to sit up in chair in the daytime use I-S and flutter valve for pulmonary  toiletry and then prone in bed when at night.  Pharyngeal suction as needed will be used.  SpO2: 93 % O2 Flow Rate (L/min): 2 L/min  Recent Labs  Lab 05/02/19 1440 05/03/19 0257 05/04/19 0122 05/05/19 0111 05/08/19 0828 05/09/19 0515 05/09/19 0719  CRP 7.6* 12.2* 9.8* 6.4* 8.4* 27.2*  --  DDIMER 1.11* 1.51* 1.14* 0.96* 2.43* 1.69*  --   FERRITIN 66 91 127 132 112 183  --   BNP 214.4*  --   --   --  262.6*  --   --   PROCALCITON 0.13 0.13 0.18  --  6.57  --  30.52    2.  AKI on CKD 4.  Baseline creatinine close to 2.  Gently diurese and monitor.  3.  COPD.  On 2 L nasal cannula home oxygen.  Supportive care.  No extra wheezing.  She is to be chronically on steroids.  4.  Acute on chronic CHF.  Type unknown no echo on file, Lasix, get echo.  5.  Hypertension.  Stable on combination of Norvasc and labetalol.  6.  GERD.  On PPI continue.  7.  History of paroxysmal atrial fibrillation.  Mali vas 2 score of greater than 3.  On labetalol, off anticoagulation due to GI bleed.  8.  Dyslipidemia.  Statin.  9.  History of dysphagia.  With new pneumonia speech will be requested to evaluate, patient has refused evaluation in the past.  10.  Anxiety and depression.  On home medications.  11.  Arthritis.  On methotrexate.  12.  Chronic pain and high-dose narcotic use at home.  Minimize.  Could be the underlying issue causing her to aspirate.   13.  Steroid-induced hyperglycemia.  Sliding scale.  Lab Results  Component Value Date   HGBA1C 6.3 (H) 05/02/2019   CBG (last 3)  Recent Labs    05/07/19 1703 05/08/19 2225 05/09/19 0757  GLUCAP 153* 136* 183*       Condition -  Guarded  Family Communication  : Daughter called on 05/09/2019 and updated over her cell phone  Code Status :  DNR  Diet :   Diet Order            Diet renal/carb modified with fluid restriction Diet-HS Snack? Nothing; Room service appropriate? Yes; Fluid consistency: Thin  Diet effective now                Disposition Plan  :  PCU  Consults  :  None  Procedures  :     PUD Prophylaxis : PPI  DVT Prophylaxis  :  Heparin   Lab Results  Component Value Date   PLT 223 05/09/2019    Inpatient Medications  Scheduled Meds: . amLODipine  10 mg Oral Daily  . artificial tears   Both Eyes BID  . atorvastatin  40 mg Oral Daily  . brimonidine  1 drop Both Eyes TID  . Chlorhexidine Gluconate Cloth  6 each Topical Daily  . doxycycline  100 mg Oral Q12H  . DULoxetine  60 mg Oral Daily  . escitalopram  5 mg Oral Daily  . folic acid  1 mg Oral Daily  . furosemide  40 mg Intravenous Once  . gabapentin  100 mg Oral BID  . heparin injection (subcutaneous)  7,500 Units Subcutaneous Q8H  . insulin aspart  0-5 Units Subcutaneous QHS  . insulin aspart  0-9 Units Subcutaneous TID WC  . labetalol  50 mg Oral BID  . latanoprost  1 drop Both Eyes QHS  . [START ON 05/10/2019] methylPREDNISolone (SOLU-MEDROL) injection  60 mg Intravenous Daily  . pantoprazole  40 mg Oral Daily  . sodium chloride flush  10-40 mL Intracatheter Q12H   Continuous Infusions: PRN Meds:.acetaminophen, bisacodyl, lip balm, [DISCONTINUED] ondansetron **OR** ondansetron (ZOFRAN) IV, polyethylene glycol, sodium phosphate  Antibiotics  :    Anti-infectives (From admission, onward)   Start     Dose/Rate Route Frequency Ordered Stop   05/09/19 1030  doxycycline (VIBRA-TABS) tablet 100 mg     100 mg Oral Every 12 hours 05/09/19 0952     05/08/19 1415  cefTRIAXone (ROCEPHIN) 2 g in sodium chloride 0.9 % 100 mL IVPB  Status:  Discontinued     2 g 200 mL/hr over 30 Minutes Intravenous Every 24 hours 05/08/19 1411 05/09/19 0719   05/08/19 1415  azithromycin (ZITHROMAX) 500 mg in sodium chloride 0.9 % 250 mL IVPB  Status:  Discontinued     500 mg 250 mL/hr over 60 Minutes Intravenous Every 24 hours 05/08/19 1411 05/09/19 0719       Time Spent in minutes  30   Lala Lund M.D on 05/09/2019 at 9:55 AM  To  page go to [email or web address] - password [facility]  [facility] -  Office  [phone]   See all Orders from today for further details    Objective:   Vitals:   05/09/19 0045 05/09/19 0300 05/09/19 0500 05/09/19 0751  BP: (!) 119/57 105/70  129/67  Pulse: 64 68  77  Resp: (!) 21 16  19   Temp:  98.6 F (37 C) (!) 97.5 F (36.4 C) 97.9 F (36.6 C)  TempSrc:  Oral Oral Axillary  SpO2: 95% 94%  93%  Weight:  65.5 kg    Height:  5\' 2"  (1.575 m)      Wt Readings from Last 3 Encounters:  05/09/19 65.5 kg  05/05/19 67.8 kg     Intake/Output Summary (Last 24 hours) at 05/09/2019 0955 Last data filed at 05/09/2019 0400 Gross per 24 hour  Intake 380 ml  Output -  Net 380 ml     Physical Exam  Awake Alert, Oriented X 3, No new F.N deficits, Normal affect [place].AT,PERRAL Supple Neck,No JVD, No cervical lymphadenopathy appriciated.  Symmetrical Chest wall movement, Good air movement bilaterally, CTAB RRR,No Gallops,Rubs or new Murmurs, No Parasternal Heave +ve B.Sounds, Abd Soft, No tenderness, No organomegaly appriciated, No rebound - guarding or rigidity. No Cyanosis, Clubbing or edema, No new Rash or bruise       Data Review:    CBC Recent Labs  Lab 05/03/19 0257 05/04/19 0122 05/05/19 0111 05/08/19 0725 05/08/19 0828 05/09/19 0515  WBC 5.9 8.7 9.9  --  30.4* 32.1*  HGB 13.2 11.6* 11.4* 13.3 13.8 11.9*  HCT 40.7 35.9* 35.3* 39.0 40.7 35.5*  PLT 148* 162 173  --  213 223  MCV 96.0 97.3 96.4  --  97.6 96.2  MCH 31.1 31.4 31.1  --  33.1 32.2  MCHC 32.4 32.3 32.3  --  33.9 33.5  RDW 17.1* 17.1* 17.1*  --  16.9* 17.1*  LYMPHSABS 0.4* 0.6* 0.5*  --  0.9 0.6*  MONOABS 0.3 1.0 1.1*  --  1.9* 0.6  EOSABS 0.0 0.0 0.0  --  1.9* 0.0  BASOSABS 0.0 0.0 0.0  --  0.0 0.0    Chemistries  Recent Labs  Lab 05/03/19 0257 05/04/19 0122 05/05/19 0111 05/07/19 0608 05/08/19 0725 05/08/19 0828 05/09/19 0515  NA 139 142 140 139 139 140 142  K 4.1 4.1 4.3 4.0 3.4* 4.4  3.8  CL 100 98 98 99  --  102 98  CO2 28 30 29 28   --  22 28  GLUCOSE 153* 133* 131* 171*  --  93 191*  BUN 33* 54* 60*  58*  --  50* 56*  CREATININE 1.94* 2.01* 2.00* 1.66*  --  1.94* 2.21*  CALCIUM 8.4* 8.3* 8.2* 8.4*  --  8.6* 8.3*  MG 2.1 2.3 2.1  --   --   --  1.7  AST 35 36 38 30  --   --  19  ALT 28 29 31  40  --   --  30  ALKPHOS 62 52 50 53  --   --  52  BILITOT 0.8 0.5 0.7 0.7  --   --  0.6   ------------------------------------------------------------------------------------------------------------------ Recent Labs    05/08/19 0939  TRIG 159*    Lab Results  Component Value Date   HGBA1C 6.3 (H) 05/02/2019   ------------------------------------------------------------------------------------------------------------------ No results for input(s): TSH, T4TOTAL, T3FREE, THYROIDAB in the last 72 hours.  Invalid input(s): FREET3  Cardiac Enzymes No results for input(s): CKMB, TROPONINI, MYOGLOBIN in the last 168 hours.  Invalid input(s): CK ------------------------------------------------------------------------------------------------------------------    Component Value Date/Time   BNP 262.6 (H) 05/08/2019 [id number]    Micro Results Recent Results (from the past 240 hour(s))  Blood culture (routine x 2)     Status: None   Collection Time: 05/02/19  2:28 PM   Specimen: BLOOD  Result Value Ref Range Status   Specimen Description BLOOD RIGHT ANTECUBITAL  Final   Special Requests   Final    BOTTLES DRAWN AEROBIC AND ANAEROBIC Blood Culture adequate volume   Culture   Final    NO GROWTH 5 DAYS Performed at [facility], [address] [place], [place] [address]    Report Status 05/07/2019 FINAL  Final  Blood culture (routine x 2)     Status: Abnormal   Collection Time: 05/02/19  2:30 PM   Specimen: BLOOD  Result Value Ref Range Status   Specimen Description BLOOD LEFT ANTECUBITAL  Final   Special Requests   Final    BOTTLES DRAWN AEROBIC ONLY Blood  Culture results may not be optimal due to an inadequate volume of blood received in culture bottles   Culture  Setup Time   Final    GRAM POSITIVE COCCI IN CLUSTERS AEROBIC BOTTLE ONLY CRITICAL RESULT CALLED TO, READ BACK BY AND VERIFIED WITH: T. MEYER,PHARMD 2311 05/04/2019 T. TYSOR    Culture (A)  Final    STAPHYLOCOCCUS SPECIES (COAGULASE NEGATIVE) THE SIGNIFICANCE OF ISOLATING THIS ORGANISM FROM A SINGLE SET OF BLOOD CULTURES WHEN MULTIPLE SETS ARE DRAWN IS UNCERTAIN. PLEASE NOTIFY THE MICROBIOLOGY DEPARTMENT WITHIN ONE WEEK IF SPECIATION AND SENSITIVITIES ARE REQUIRED. Performed at [facility], [place] [address], [place], [place] [address]    Report Status 05/06/2019 FINAL  Final  Blood Culture (routine x 2)     Status: None (Preliminary result)   Collection Time: 05/08/19  8:05 AM   Specimen: BLOOD  Result Value Ref Range Status   Specimen Description BLOOD RIGHT ANTECUBITAL  Final   Special Requests   Final    BOTTLES DRAWN AEROBIC AND ANAEROBIC Blood Culture adequate volume   Culture   Final    NO GROWTH < 12 HOURS Performed at [facility], [place] [address] [place],  [address]    Report Status PENDING  Incomplete  Blood Culture (routine x 2)     Status: None (Preliminary result)   Collection Time: 05/08/19  8:28 AM   Specimen: BLOOD RIGHT FOREARM  Result Value Ref Range Status   Specimen Description BLOOD RIGHT FOREARM  Final   Special  Requests   Final    BOTTLES DRAWN AEROBIC ONLY Blood Culture results may not be optimal due to an inadequate volume of blood received in culture bottles   Culture   Final    NO GROWTH < 12 HOURS Performed at [place] [address], [place], [place] [address]    Report Status PENDING  Incomplete    Radiology Reports DG Chest Port 1 View  Result Date: 05/08/2019 CLINICAL DATA:  Shortness of breath, respiratory distress, COVID (+), EXAM: PORTABLE CHEST 1 VIEW COMPARISON:  Chest radiograph 05/04/2019 FINDINGS:  Heart size within normal limits.  Aortic atherosclerosis. Hazy opacity within the left mid and upper lung field. Redemonstrated left basilar pleuroparenchymal scarring. Please note the patient's chin partially obscures the right lung apex. No evidence of pneumothorax. No acute bony abnormality. Cervical spinal fusion hardware. Overlying cardiac monitoring leads. IMPRESSION: Hazy opacity within the left mid and upper lung field, suspicious for pneumonia given provided history. Redemonstrated left basilar pleuroparenchymal scarring. Aortic atherosclerosis. Electronically Signed   By: Kellie Simmering DO   On: 05/08/2019 07:50   DG Chest Port 1 View  Result Date: 05/04/2019 CLINICAL DATA:  Follow-up pneumonia due to COVID-19 EXAM: PORTABLE CHEST 1 VIEW COMPARISON:  05/02/2019 FINDINGS: Normal heart size. Aortic atherosclerosis the lungs are hyperinflated and there are coarsened interstitial markings of COPD/emphysema. Blunting of the left costophrenic angle is noted likely reflecting pleuroparenchymal scarring. No airspace consolidation. IMPRESSION: 1. No acute cardiopulmonary abnormalities. 2. Chronic interstitial coarsening and left base pleuroparenchymal scarring. Electronically Signed   By: Kerby Moors M.D.   On: 05/04/2019 11:38   DG Chest Port 1 View  Result Date: 05/02/2019 CLINICAL DATA:  Shortness of breath with cough and fever EXAM: PORTABLE CHEST 1 VIEW COMPARISON:  July 21, 2018 FINDINGS: There is mild bibasilar atelectasis. There is no edema or consolidation. Heart is upper normal in size with pulmonary vascularity normal. No adenopathy. There is aortic atherosclerosis. There is postoperative change in the lower cervical region. IMPRESSION: Bibasilar atelectasis. No edema or consolidation. Heart upper normal in size. No adenopathy. Aortic Atherosclerosis (ICD10-I70.0). Electronically Signed   By: Lowella Grip III M.D.   On: 05/02/2019 13:36   [place] EKG SITE RITE  Result Date: 05/08/2019 If  Site Rite image not attached, placement could not be confirmed due to current cardiac rhythm.

## 2019-05-09 NOTE — Progress Notes (Signed)
CRITICAL VALUE ALERT  Critical Value:  MRSA PCR positive  Date & Time Notied:  05/09/2019 1812  Provider Notified: Candiss Norse  Orders Received/Actions taken: MD aware and responded to notification

## 2019-05-09 NOTE — Progress Notes (Signed)
Occupational Therapy Evaluation Patient Details Name: Joyce Ortiz MRN: [id number] DOB: Oct 05, 1940 Today's Date: 05/09/2019    History of Present Illness Pt is a 78 y.o. female recently admitted to [facility] 05/02/19-05/07/19 with d/c back to ALF, now readmitted 05/08/19 with respiratory distress and AMS; worked up for CHF and PNA. PMH includes CVA (residual R-side deficits), CKD IV, HTN, DM, COPD, CHF, dysphagia, afib.   Clinical Impression   PTA pt was residing at an ALF requiring assist for self-care and functional transfer tasks. Pt requires 2L [place] at baseline, however currently on 4L [place] at the hospital. Pt currently requires variable min to total assist for self-care and functional transfer tasks. Pt tolerated sitting EOB 20 min with supervision, noting 0 instances of loss of balance. Pt engaged in self-feeding, grooming/hygiene, and lower body dressing task while seated edge of bed. Pt's SpO2 decreased to 87% during self-feeding task with quick return back to 90s following rest break. Pt's SpO2 maintained in 90s throughout rest of self-care tasks. 3/4 DOE. Educated pt on energy conservation, pursed lip breathing, and relaxation strategies with fair understanding. Pt demonstrates decreased ROM, FMC, GMC, strength, endurance, balance, standing tolerance, and activity tolerance impacting ability to complete self-care and functional transfer tasks. Recommend skilled OT services to address above deficits in order to promote function and prevent further decline. Recommend 24/7 assist and [facility] OT for additional rehab following hospital discharge.    Follow Up Recommendations  Home health OT;Supervision/Assistance - 24 hour;Other (comment)(Return to ALF)    Equipment Recommendations  None recommended by OT    Recommendations for Other Services       Precautions / Restrictions Precautions Precautions: Fall Restrictions Weight Bearing Restrictions: No      Mobility Bed Mobility Overal bed mobility:  Needs Assistance Bed Mobility: Supine to Sit;Sit to Supine     Supine to sit: Supervision;HOB elevated Sit to supine: Supervision;HOB elevated   General bed mobility comments: Increased time needed to complete.  Transfers                 General transfer comment: Pt unable to engage in transfer tasks this date due to fatigue and SOB. Pt declined OOB mobility    Balance Overall balance assessment: Needs assistance   Sitting balance-Leahy Scale: Fair                                     ADL either performed or assessed with clinical judgement   ADL Overall ADL's : Needs assistance/impaired Eating/Feeding: Minimal assistance;Sitting   Grooming: Moderate assistance;Sitting;Wash/dry hands;Wash/dry face;Brushing hair   Upper Body Bathing: Moderate assistance;Sitting;Maximal assistance   Lower Body Bathing: Total assistance;Sitting/lateral leans;Sit to/from stand   Upper Body Dressing : Moderate assistance;Sitting;Maximal assistance   Lower Body Dressing: Minimal assistance;Sitting/lateral leans(Able to don/doff socks while seated EOB)   Toilet Transfer: Minimal assistance;BSC   Toileting- Clothing Manipulation and Hygiene: Moderate assistance;Sitting/lateral lean;Sit to/from stand       Functional mobility during ADLs: Minimal assistance       Vision         Perception     Praxis      Pertinent Vitals/Pain Pain Assessment: No/denies pain     Hand Dominance Right(due to hx of CVA with residual deficits, uses left hand)   Extremity/Trunk Assessment Upper Extremity Assessment Upper Extremity Assessment: RUE deficits/detail;LUE deficits/detail RUE Deficits / Details: Previous CVA with residual deficits. Shld flex ~20 degrees.  Tone noted in right hand. LUE Deficits / Details: Shld flex ~25 degrees.    Lower Extremity Assessment Lower Extremity Assessment: Defer to PT evaluation       Communication Communication Communication: No  difficulties   Cognition Arousal/Alertness: Awake/alert Behavior During Therapy: WFL for tasks assessed/performed Overall Cognitive Status: No family/caregiver present to determine baseline cognitive functioning                                 General Comments: Pt follows all commands, requiring min encouragement to engage in therapy tasks.   General Comments  Pt on 4L [place] with SpO2 decreasing to 87% during self-feeding task. Otherwise SpO2 maintained in 90s throughout.    Exercises     Shoulder Instructions      Home Living Family/patient expects to be discharged to:: Assisted living                             Home Equipment: Walker - 4 wheels;Bedside commode;Shower seat;Wheelchair - manual          Prior Functioning/Environment Level of Independence: Needs assistance  Gait / Transfers Assistance Needed: Pt does not ambulate, instead stand pivot transfers to/from surfaces with assist. Pt has a w/c that she uses. ADL's / Homemaking Assistance Needed: Pt reports that she requires assist for all self-care tasks. Pt able to feed herself with setup. Pt reports that staff have to assist her with grooming/hygiene, toileting, dressing, and showering tasks. Unsure of how much assistance they provide.             OT Problem List: Decreased strength;Decreased range of motion;Decreased activity tolerance;Impaired balance (sitting and/or standing);Decreased coordination;Decreased safety awareness;Cardiopulmonary status limiting activity      OT Treatment/Interventions: Self-care/ADL training;Therapeutic exercise;Neuromuscular education;Energy conservation;DME and/or AE instruction;Therapeutic activities;Patient/family education;Balance training    OT Goals(Current goals can be found in the care plan section) Acute Rehab OT Goals Patient Stated Goal: To be able to breath Time For Goal Achievement: 05/23/19 Potential to Achieve Goals: Good ADL Goals Pt Will  Perform Eating: with set-up;sitting Pt Will Perform Grooming: with min assist;sitting Pt Will Transfer to Toilet: with min assist;bedside commode Pt Will Perform Toileting - Clothing Manipulation and hygiene: with min assist;sit to/from stand Additional ADL Goal #1: Pt to recall and demonstrate breathing exercises with 0 verbal cues on technique.  OT Frequency: Min 2X/week   Barriers to D/C:            Co-evaluation              AM-PAC OT "6 Clicks" Daily Activity     Outcome Measure Help from another person eating meals?: A Little Help from another person taking care of personal grooming?: A Lot Help from another person toileting, which includes using toliet, bedpan, or urinal?: A Lot Help from another person bathing (including washing, rinsing, drying)?: A Lot Help from another person to put on and taking off regular upper body clothing?: A Lot Help from another person to put on and taking off regular lower body clothing?: A Lot 6 Click Score: 13   End of Session Equipment Utilized During Treatment: Oxygen Nurse Communication: Mobility status  Activity Tolerance: Patient limited by fatigue(Limited by SOB) Patient left: in bed;with call bell/phone within reach;with bed alarm set  OT Visit Diagnosis: Unsteadiness on feet (R26.81);Muscle weakness (generalized) (M62.81)  Time: [id number] OT Time Calculation (min): 32 min Charges:  OT General Charges $OT Visit: 1 Visit OT Evaluation $OT Eval Moderate Complexity: 1 Mod OT Treatments $Therapeutic Activity: 8-22 mins  Mauri Brooklyn OTR/L [phone]   Mauri Brooklyn 05/09/2019, 4:21 PM

## 2019-05-09 NOTE — Plan of Care (Signed)
Pt wears 2L [place] at baseline; oxygen increased to 4L [place] per MD. Pt educated on use of IS and flutter valve. Pt sat up in chair, encouraged to cough and deep breathe. Oxycodone ordered and given for chronic leg/foot neuropathy pain. RN updated SW. MRSA results pending. Will continue to monitor.  Problem: Education: Goal: Knowledge of General Education information will improve Description: Including pain rating scale, medication(s)/side effects and non-pharmacologic comfort measures Outcome: Progressing   Problem: Health Behavior/Discharge Planning: Goal: Ability to manage health-related needs will improve Outcome: Progressing   Problem: Clinical Measurements: Goal: Ability to maintain clinical measurements within normal limits will improve Outcome: Progressing Goal: Will remain free from infection Outcome: Progressing Goal: Diagnostic test results will improve Outcome: Progressing Goal: Respiratory complications will improve Outcome: Not Progressing Goal: Cardiovascular complication will be avoided Outcome: Progressing

## 2019-05-09 NOTE — Progress Notes (Signed)
Pharmacy Antibiotic Note  PALIN KLAMM is a 78 y.o. female admitted on 05/08/2019 with respiratory distress possibly due to superimposed bacterial PNA and CHF.  Pharmacy has been consulted for [facility] dosing. Patient is also ordered doxycycline.   Plan: Unasyn 1.5 g iv q 12h F/U renal function, culture results, plans for antibiotics  Height: 5\' 2"  (157.5 cm) Weight: 144 lb 6.4 oz (65.5 kg) IBW/kg (Calculated) : 50.1  Temp (24hrs), Avg:98 F (36.7 C), Min:97.5 F (36.4 C), Max:98.6 F (37 C)  Recent Labs  Lab 05/02/19 1418 05/02/19 1641 05/03/19 0257 05/04/19 0122 05/05/19 0111 05/07/19 0608 05/08/19 0828 05/09/19 0515  WBC  --   --  5.9 8.7 9.9  --  30.4* 32.1*  CREATININE  --   --  1.94* 2.01* 2.00* 1.66* 1.94* 2.21*  LATICACIDVEN 1.2 0.8  --   --   --   --  1.9  --     Estimated Creatinine Clearance: 18.3 mL/min (A) (by C-G formula based on SCr of 2.21 mg/dL (H)).    Allergies  Allergen Reactions  . Aspirin     Other reaction(s): GI Upset (intolerance)  . Biaxin [Clarithromycin] Other (See Comments)    [facility]   . Codeine Nausea And Vomiting  . Moxifloxacin Itching    Antimicrobials this admission: 12/9 azithromycin/ceftriaxone x 1 12/10 Unasyn >>  12/10 doxycycline >>  Microbiology results: 12/9 BCx: ngtd MRSA PCR: sent  Thank you for allowing pharmacy to be a part of this patient's care.  Ulice Dash D 05/09/2019 11:18 AM

## 2019-05-10 ENCOUNTER — Encounter (HOSPITAL_COMMUNITY): Payer: Self-pay | Admitting: Internal Medicine

## 2019-05-10 ENCOUNTER — Inpatient Hospital Stay (HOSPITAL_COMMUNITY): Payer: Medicare Other

## 2019-05-10 ENCOUNTER — Other Ambulatory Visit: Payer: Self-pay

## 2019-05-10 DIAGNOSIS — I5031 Acute diastolic (congestive) heart failure: Secondary | ICD-10-CM

## 2019-05-10 LAB — CBC WITH DIFFERENTIAL/PLATELET
Abs Immature Granulocytes: 0.38 10*3/uL — ABNORMAL HIGH (ref 0.00–0.07)
Basophils Absolute: 0.1 10*3/uL (ref 0.0–0.1)
Basophils Relative: 0 %
Eosinophils Absolute: 0 10*3/uL (ref 0.0–0.5)
Eosinophils Relative: 0 %
HCT: 29.8 % — ABNORMAL LOW (ref 36.0–46.0)
Hemoglobin: 10.5 g/dL — ABNORMAL LOW (ref 12.0–15.0)
Immature Granulocytes: 2 %
Lymphocytes Relative: 2 %
Lymphs Abs: 0.5 10*3/uL — ABNORMAL LOW (ref 0.7–4.0)
MCH: 33.7 pg (ref 26.0–34.0)
MCHC: 35.2 g/dL (ref 30.0–36.0)
MCV: 95.5 fL (ref 80.0–100.0)
Monocytes Absolute: 1.3 10*3/uL — ABNORMAL HIGH (ref 0.1–1.0)
Monocytes Relative: 5 %
Neutro Abs: 23.2 10*3/uL — ABNORMAL HIGH (ref 1.7–7.7)
Neutrophils Relative %: 91 %
Platelets: 218 10*3/uL (ref 150–400)
RBC: 3.12 MIL/uL — ABNORMAL LOW (ref 3.87–5.11)
RDW: 17 % — ABNORMAL HIGH (ref 11.5–15.5)
WBC: 25.4 10*3/uL — ABNORMAL HIGH (ref 4.0–10.5)
nRBC: 0 % (ref 0.0–0.2)

## 2019-05-10 LAB — GLUCOSE, CAPILLARY
Glucose-Capillary: 125 mg/dL — ABNORMAL HIGH (ref 70–99)
Glucose-Capillary: 139 mg/dL — ABNORMAL HIGH (ref 70–99)
Glucose-Capillary: 177 mg/dL — ABNORMAL HIGH (ref 70–99)
Glucose-Capillary: 167 mg/dL — ABNORMAL HIGH (ref 70–99)

## 2019-05-10 LAB — D-DIMER, QUANTITATIVE: D-Dimer, Quant: 2.63 ug/mL-FEU — ABNORMAL HIGH (ref 0.00–0.50)

## 2019-05-10 LAB — COMPREHENSIVE METABOLIC PANEL
ALT: 28 U/L (ref 0–44)
AST: 19 U/L (ref 15–41)
Albumin: 2.4 g/dL — ABNORMAL LOW (ref 3.5–5.0)
Alkaline Phosphatase: 64 U/L (ref 38–126)
Anion gap: 14 (ref 5–15)
BUN: 53 mg/dL — ABNORMAL HIGH (ref 8–23)
CO2: 27 mmol/L (ref 22–32)
Calcium: 8.1 mg/dL — ABNORMAL LOW (ref 8.9–10.3)
Chloride: 96 mmol/L — ABNORMAL LOW (ref 98–111)
Creatinine, Ser: 1.83 mg/dL — ABNORMAL HIGH (ref 0.44–1.00)
GFR calc Af Amer: 30 mL/min — ABNORMAL LOW (ref >60)
GFR calc non Af Amer: 26 mL/min — ABNORMAL LOW (ref >60)
Glucose, Bld: 134 mg/dL — ABNORMAL HIGH (ref 70–99)
Potassium: 3.4 mmol/L — ABNORMAL LOW (ref 3.5–5.1)
Sodium: 137 mmol/L (ref 135–145)
Total Bilirubin: 0.7 mg/dL (ref 0.3–1.2)
Total Protein: 5.9 g/dL — ABNORMAL LOW (ref 6.5–8.1)

## 2019-05-10 LAB — C-REACTIVE PROTEIN: CRP: 23.2 mg/dL — ABNORMAL HIGH (ref <1.0)

## 2019-05-10 LAB — MAGNESIUM: Magnesium: 1.8 mg/dL (ref 1.7–2.4)

## 2019-05-10 LAB — PROCALCITONIN: Procalcitonin: 25.7 ng/mL

## 2019-05-10 LAB — PATHOLOGIST SMEAR REVIEW

## 2019-05-10 LAB — BRAIN NATRIURETIC PEPTIDE: B Natriuretic Peptide: 178.7 pg/mL — ABNORMAL HIGH (ref 0.0–100.0)

## 2019-05-10 MED ORDER — POTASSIUM CHLORIDE CRYS ER 20 MEQ PO TBCR
40.0000 meq | EXTENDED_RELEASE_TABLET | Freq: Once | ORAL | Status: AC
  Administered 2019-05-10: 40 meq via ORAL
  Filled 2019-05-10: qty 2

## 2019-05-10 MED ORDER — HEPARIN SODIUM (PORCINE) 5000 UNIT/ML IJ SOLN
5000.0000 [IU] | Freq: Three times a day (TID) | INTRAMUSCULAR | Status: DC
  Administered 2019-05-10 – 2019-05-13 (×9): 5000 [IU] via SUBCUTANEOUS
  Filled 2019-05-10 (×9): qty 1

## 2019-05-10 MED ORDER — FUROSEMIDE 10 MG/ML IJ SOLN
40.0000 mg | Freq: Once | INTRAMUSCULAR | Status: AC
  Administered 2019-05-10: 12:00:00 40 mg via INTRAVENOUS
  Filled 2019-05-10: qty 4

## 2019-05-10 MED ORDER — HYDRALAZINE HCL 25 MG PO TABS
50.0000 mg | ORAL_TABLET | Freq: Three times a day (TID) | ORAL | Status: DC
  Administered 2019-05-10 – 2019-05-13 (×8): 50 mg via ORAL
  Filled 2019-05-10 (×3): qty 2
  Filled 2019-05-10: qty 1
  Filled 2019-05-10 (×4): qty 2
  Filled 2019-05-10 (×2): qty 1

## 2019-05-10 MED ORDER — POTASSIUM CHLORIDE CRYS ER 20 MEQ PO TBCR
20.0000 meq | EXTENDED_RELEASE_TABLET | Freq: Once | ORAL | Status: AC
  Administered 2019-05-10: 16:00:00 20 meq via ORAL
  Filled 2019-05-10: qty 1

## 2019-05-10 MED ORDER — TRAMADOL HCL 50 MG PO TABS
50.0000 mg | ORAL_TABLET | Freq: Four times a day (QID) | ORAL | Status: DC | PRN
  Administered 2019-05-11 – 2019-05-12 (×3): 50 mg via ORAL
  Filled 2019-05-10 (×3): qty 1

## 2019-05-10 MED ORDER — METHYLPREDNISOLONE SODIUM SUCC 40 MG IJ SOLR
30.0000 mg | Freq: Every day | INTRAMUSCULAR | Status: DC
  Administered 2019-05-11 – 2019-05-12 (×2): 30 mg via INTRAVENOUS
  Filled 2019-05-10 (×2): qty 1

## 2019-05-10 NOTE — Progress Notes (Signed)
Just standard dose SQ heparin per Dr Candiss Norse due to her impaired renal function.   Onnie Boer, PharmD, BCIDP, AAHIVP, CPP Infectious Disease Pharmacist 05/10/2019 1:25 PM

## 2019-05-10 NOTE — Plan of Care (Signed)
  Problem: Education: Goal: Knowledge of General Education information will improve Description: Including pain rating scale, medication(s)/side effects and non-pharmacologic comfort measures Outcome: Progressing   Problem: Health Behavior/Discharge Planning: Goal: Ability to manage health-related needs will improve Outcome: Progressing   Problem: Clinical Measurements: Goal: Ability to maintain clinical measurements within normal limits will improve Outcome: Progressing Goal: Will remain free from infection Outcome: Progressing Goal: Diagnostic test results will improve Outcome: Progressing Goal: Respiratory complications will improve Outcome: Progressing Goal: Cardiovascular complication will be avoided Outcome: Progressing   Problem: Activity: Goal: Risk for activity intolerance will decrease Outcome: Progressing   Problem: Nutrition: Goal: Adequate nutrition will be maintained Outcome: Progressing   Problem: Coping: Goal: Level of anxiety will decrease Outcome: Progressing   Problem: Elimination: Goal: Will not experience complications related to bowel motility Outcome: Progressing Goal: Will not experience complications related to urinary retention Outcome: Progressing   Problem: Pain Managment: Goal: General experience of comfort will improve Outcome: Progressing   Problem: Safety: Goal: Ability to remain free from injury will improve Outcome: Progressing   Problem: Skin Integrity: Goal: Risk for impaired skin integrity will decrease Outcome: Progressing   Problem: Education: Goal: Knowledge of risk factors and measures for prevention of condition will improve Outcome: Progressing   Problem: Coping: Goal: Psychosocial and spiritual needs will be supported Outcome: Progressing   Problem: Respiratory: Goal: Will maintain a patent airway Outcome: Progressing Goal: Complications related to the disease process, condition or treatment will be avoided or  minimized Outcome: Progressing   Problem: Education: Goal: Ability to describe self-care measures that may prevent or decrease complications (Diabetes Survival Skills Education) will improve Outcome: Progressing Goal: Individualized Educational Video(s) Outcome: Progressing   Problem: Coping: Goal: Ability to adjust to condition or change in health will improve Outcome: Progressing   Problem: Fluid Volume: Goal: Ability to maintain a balanced intake and output will improve Outcome: Progressing   Problem: Health Behavior/Discharge Planning: Goal: Ability to identify and utilize available resources and services will improve Outcome: Progressing Goal: Ability to manage health-related needs will improve Outcome: Progressing   Problem: Metabolic: Goal: Ability to maintain appropriate glucose levels will improve Outcome: Progressing   Problem: Nutritional: Goal: Maintenance of adequate nutrition will improve Outcome: Progressing Goal: Progress toward achieving an optimal weight will improve Outcome: Progressing   Problem: Skin Integrity: Goal: Risk for impaired skin integrity will decrease Outcome: Progressing   Problem: Tissue Perfusion: Goal: Adequacy of tissue perfusion will improve Outcome: Progressing   

## 2019-05-10 NOTE — Progress Notes (Signed)
PROGRESS NOTE                                                                                                                                                                                                             Patient Demographics:    Joyce Ortiz, is a 78 y.o. female, DOB - [birth year], [id number]  Outpatient Primary MD for the patient is Bonnita Nasuti, MD    LOS - 2  Admit date - 05/08/2019    Chief Complaint  Patient presents with  . Respiratory Distress       Brief Narrative   Joyce Ortiz is a 78 y.o. female with medical history significant of CVA; stage IV CKD; HTN; DM; COPD; CHF (uncertain systolic vs. Diastolic); chronic debility and dysphagia (has refused SLP evaluation or modification of diet); and afib (off Eliquis due to GI bleed in 04/2018) presenting with respiratory distress.  She was previously admitted at [facility] from 12/3-8 with  COVID-19-associated PNA, she was readmitted with hypoxia most likely due to superimposed bacterial pneumonia and CHF.   Subjective:   Patient in bed, appears comfortable, denies any headache, no fever, no chest pain or pressure, does have a cough no shortness of breath , no abdominal pain. No focal weakness.    Assessment  & Plan :     1. 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  She has profound leukocytosis, elevated procalcitonin along with high proBNP.  She has been placed on appropriate antibiotics, blood cultures negative, continue IV Lasix.  Monitor closely.  Note she uses 2 L nasal cannula at baseline.  I suspect that she is intermittently aspirating under the effect of high-dose narcotics which she takes at home.  Speech following and currently on dysphagia 3 diet.  Encouraged the  patient to sit up in chair in the daytime use I-S and flutter valve for pulmonary toiletry and then prone in bed when at night.  Pharyngeal suction as needed will be used.  SpO2: 94 % O2 Flow Rate (L/min): 4 L/min  Recent Labs  Lab 05/04/19 0122 05/05/19 0111 05/08/19 0828 05/09/19 0515 05/09/19 0719 05/10/19 0407  CRP 9.8* 6.4* 8.4*  27.2*  --  23.2*  DDIMER 1.14* 0.96* 2.43* 1.69*  --  2.63*  FERRITIN 127 132 112 183  --   --   BNP  --   --  262.6*  --   --  178.7*  PROCALCITON 0.18  --  6.57  --  30.52 25.70    2.  AKI on CKD 4.  Baseline creatinine close to 2.  Gently diurese and monitor.  3.  COPD.  On 2 L nasal cannula home oxygen.  Supportive care.  No extra wheezing.  She is to be chronically on steroids.  4.  Acute on chronic CHF.  Type unknown no echo on file, continue IV Lasix, get echo.  5.  Hypertension.  On Norvasc and labetalol, added hydralazine.  6.  GERD.  On PPI continue.  7.  History of paroxysmal atrial fibrillation.  Mali vas 2 score of greater than 3.  On labetalol, off anticoagulation due to GI bleed.  8.  Dyslipidemia.  Statin.  9.  History of dysphagia.  With new pneumonia speech will be requested to evaluate, patient has refused evaluation in the past.  10.  Anxiety and depression.  On home medications.  11.  Arthritis.  On methotrexate.  Hold narcotics.  Ultram ordered instead.  12.  Chronic pain and high-dose narcotic use at home.  Minimize.  Could be the underlying issue causing her to aspirate.   13.  Steroid-induced hyperglycemia.  Sliding scale.  Lab Results  Component Value Date   HGBA1C 6.3 (H) 05/02/2019   CBG (last 3)  Recent Labs    05/09/19 1637 05/09/19 2040 05/10/19 0738  GLUCAP 173* 233* 125*       Condition -  Guarded  Family Communication  : Daughter called on 05/09/2019 and updated over her cell phone  Code Status :  DNR  Diet :   Diet Order            Diet renal/carb modified with fluid restriction  Diet-HS Snack? Nothing; Room service appropriate? Yes; Fluid consistency: Thin  Diet effective now               Disposition Plan  :  PCU  Consults  :  None  Procedures  :     PUD Prophylaxis : PPI  DVT Prophylaxis  :  Heparin   Lab Results  Component Value Date   PLT 218 05/10/2019    Inpatient Medications  Scheduled Meds: . amLODipine  10 mg Oral Daily  . artificial tears   Both Eyes BID  . atorvastatin  40 mg Oral Daily  . brimonidine  1 drop Both Eyes TID  . Chlorhexidine Gluconate Cloth  6 each Topical Daily  . doxycycline  100 mg Oral Q12H  . DULoxetine  60 mg Oral Daily  . escitalopram  5 mg Oral Daily  . folic acid  1 mg Oral Daily  . gabapentin  100 mg Oral BID  . heparin injection (subcutaneous)  7,500 Units Subcutaneous Q8H  . hydrALAZINE  50 mg Oral Q8H  . insulin aspart  0-5 Units Subcutaneous QHS  . insulin aspart  0-9 Units Subcutaneous TID WC  . labetalol  50 mg Oral BID  . latanoprost  1 drop Both Eyes QHS  . methylPREDNISolone (SOLU-MEDROL) injection  40 mg Intravenous Daily  . pantoprazole  40 mg Oral Daily  . sodium chloride flush  10-40 mL Intracatheter Q12H   Continuous Infusions: . ampicillin-sulbactam (UNASYN) IV 1.5 g (05/10/19 0542)  PRN Meds:.acetaminophen, bisacodyl, lip balm, [DISCONTINUED] ondansetron **OR** ondansetron (ZOFRAN) IV, polyethylene glycol, sodium phosphate, traMADol  Antibiotics  :    Anti-infectives (From admission, onward)   Start     Dose/Rate Route Frequency Ordered Stop   05/09/19 1800  ampicillin-sulbactam (UNASYN) 1.5 g in sodium chloride 0.9 % 100 mL IVPB     1.5 g 200 mL/hr over 30 Minutes Intravenous Every 12 hours 05/09/19 1111     05/09/19 1030  doxycycline (VIBRA-TABS) tablet 100 mg     100 mg Oral Every 12 hours 05/09/19 0952     05/08/19 1415  cefTRIAXone (ROCEPHIN) 2 g in sodium chloride 0.9 % 100 mL IVPB  Status:  Discontinued     2 g 200 mL/hr over 30 Minutes Intravenous Every 24 hours 05/08/19  1411 05/09/19 0719   05/08/19 1415  azithromycin (ZITHROMAX) 500 mg in sodium chloride 0.9 % 250 mL IVPB  Status:  Discontinued     500 mg 250 mL/hr over 60 Minutes Intravenous Every 24 hours 05/08/19 1411 05/09/19 0719       Time Spent in minutes  30   Lala Lund M.D on 05/10/2019 at 11:28 AM  To page go to [email or web address] - password [facility]  [facility] -  Office  [phone]   See all Orders from today for further details    Objective:   Vitals:   05/10/19 1015 05/10/19 1030 05/10/19 1045 05/10/19 1121  BP:    (!) 152/128  Pulse: 72 70 61 63  Resp: (!) 23 (!) 22 (!) 22 13  Temp:    (!) 97.3 F (36.3 C)  TempSrc:    Oral  SpO2: 96% (!) 86% 96% 94%  Weight:      Height:        Wt Readings from Last 3 Encounters:  05/09/19 65.5 kg  05/05/19 67.8 kg     Intake/Output Summary (Last 24 hours) at 05/10/2019 1128 Last data filed at 05/10/2019 0600 Gross per 24 hour  Intake 298.19 ml  Output 600 ml  Net -301.81 ml     Physical Exam  Awake Alert,   No new F.N deficits, Normal affect [place].AT,PERRAL Supple Neck,No JVD, No cervical lymphadenopathy appriciated.  Symmetrical Chest wall movement, Good air movement bilaterally, Coarse B sounds RRR,No Gallops, Rubs or new Murmurs, No Parasternal Heave +ve B.Sounds, Abd Soft, No tenderness, No organomegaly appriciated, No rebound - guarding or rigidity. No Cyanosis, Clubbing or edema, No new Rash or bruise    Data Review:    CBC Recent Labs  Lab 05/04/19 0122 05/05/19 0111 05/08/19 0725 05/08/19 0828 05/09/19 0515 05/10/19 0407  WBC 8.7 9.9  --  30.4* 32.1* 25.4*  HGB 11.6* 11.4* 13.3 13.8 11.9* 10.5*  HCT 35.9* 35.3* 39.0 40.7 35.5* 29.8*  PLT 162 173  --  213 223 218  MCV 97.3 96.4  --  97.6 96.2 95.5  MCH 31.4 31.1  --  33.1 32.2 33.7  MCHC 32.3 32.3  --  33.9 33.5 35.2  RDW 17.1* 17.1*  --  16.9* 17.1* 17.0*  LYMPHSABS 0.6* 0.5*  --  0.9 0.6* 0.5*  MONOABS 1.0 1.1*  --  1.9* 0.6 1.3*  EOSABS  0.0 0.0  --  1.9* 0.0 0.0  BASOSABS 0.0 0.0  --  0.0 0.0 0.1    Chemistries  Recent Labs  Lab 05/04/19 0122 05/05/19 0111 05/07/19 0608 05/08/19 0725 05/08/19 0828 05/09/19 0515 05/10/19 0407  NA 142 140 139 139 140 142 137  K 4.1 4.3 4.0 3.4* 4.4 3.8 3.4*  CL 98 98 99  --  102 98 96*  CO2 30 29 28   --  22 28 27   GLUCOSE 133* 131* 171*  --  93 191* 134*  BUN 54* 60* 58*  --  50* 56* 53*  CREATININE 2.01* 2.00* 1.66*  --  1.94* 2.21* 1.83*  CALCIUM 8.3* 8.2* 8.4*  --  8.6* 8.3* 8.1*  MG 2.3 2.1  --   --   --  1.7 1.8  AST 36 38 30  --   --  19 19  ALT 29 31 40  --   --  30 28  ALKPHOS 52 50 53  --   --  52 64  BILITOT 0.5 0.7 0.7  --   --  0.6 0.7   ------------------------------------------------------------------------------------------------------------------ Recent Labs    05/08/19 0939  TRIG 159*    Lab Results  Component Value Date   HGBA1C 6.3 (H) 05/02/2019   ------------------------------------------------------------------------------------------------------------------ No results for input(s): TSH, T4TOTAL, T3FREE, THYROIDAB in the last 72 hours.  Invalid input(s): FREET3  Cardiac Enzymes No results for input(s): CKMB, TROPONINI, MYOGLOBIN in the last 168 hours.  Invalid input(s): CK ------------------------------------------------------------------------------------------------------------------    Component Value Date/Time   BNP 178.7 (H) 05/10/2019 0407    Micro Results Recent Results (from the past 240 hour(s))  Blood culture (routine x 2)     Status: None   Collection Time: 05/02/19  2:28 PM   Specimen: BLOOD  Result Value Ref Range Status   Specimen Description BLOOD RIGHT ANTECUBITAL  Final   Special Requests   Final    BOTTLES DRAWN AEROBIC AND ANAEROBIC Blood Culture adequate volume   Culture   Final    NO GROWTH 5 DAYS Performed at [facility], [address] [place], [place] [address]    Report Status 05/07/2019 FINAL   Final  Blood culture (routine x 2)     Status: Abnormal   Collection Time: 05/02/19  2:30 PM   Specimen: BLOOD  Result Value Ref Range Status   Specimen Description BLOOD LEFT ANTECUBITAL  Final   Special Requests   Final    BOTTLES DRAWN AEROBIC ONLY Blood Culture results may not be optimal due to an inadequate volume of blood received in culture bottles   Culture  Setup Time   Final    GRAM POSITIVE COCCI IN CLUSTERS AEROBIC BOTTLE ONLY CRITICAL RESULT CALLED TO, READ BACK BY AND VERIFIED WITH: T. MEYER,PHARMD 2311 05/04/2019 T. TYSOR    Culture (A)  Final    STAPHYLOCOCCUS SPECIES (COAGULASE NEGATIVE) THE SIGNIFICANCE OF ISOLATING THIS ORGANISM FROM A SINGLE SET OF BLOOD CULTURES WHEN MULTIPLE SETS ARE DRAWN IS UNCERTAIN. PLEASE NOTIFY THE MICROBIOLOGY DEPARTMENT WITHIN ONE WEEK IF SPECIATION AND SENSITIVITIES ARE REQUIRED. Performed at [facility], [place] [address] [place], [place] [address]    Report Status 05/06/2019 FINAL  Final  Blood Culture (routine x 2)     Status: None (Preliminary result)   Collection Time: 05/08/19  8:05 AM   Specimen: BLOOD  Result Value Ref Range Status   Specimen Description BLOOD RIGHT ANTECUBITAL  Final   Special Requests   Final    BOTTLES DRAWN AEROBIC AND ANAEROBIC Blood Culture adequate volume   Culture   Final    NO GROWTH 2 DAYS Performed at [facility], [place] [address] [place], [place] [address]    Report Status PENDING  Incomplete  Blood Culture (routine x 2)     Status: None (Preliminary result)   Collection Time: 05/08/19  8:28 AM   Specimen: BLOOD RIGHT FOREARM  Result Value Ref Range Status   Specimen Description BLOOD RIGHT FOREARM  Final   Special Requests   Final    BOTTLES DRAWN AEROBIC ONLY Blood Culture results may not be optimal due to an inadequate volume of blood received in culture bottles   Culture   Final    NO GROWTH 2 DAYS Performed at [facility], [place] [address], [place], [place] [address]     Report Status PENDING  Incomplete  MRSA PCR Screening     Status: Abnormal   Collection Time: 05/09/19  6:37 AM  Result Value Ref Range Status   MRSA by PCR POSITIVE (A) NEGATIVE Final    Comment:        The GeneXpert MRSA Assay (FDA approved for NASAL specimens only), is one component of a comprehensive MRSA colonization surveillance program. It is not intended to diagnose MRSA infection nor to guide or monitor treatment for MRSA infections. RESULT CALLED TO, READ BACK BY AND VERIFIED WITH: Ambrose Pancoast. RN @1812  ON 12.10.2020 BY COHEN,K Performed at [facility], [place] [address] [place], [place] [address]     Radiology Reports DG Chest [place] 1 View  Result Date: 05/08/2019 CLINICAL DATA:  Shortness of breath, respiratory distress, COVID (+), EXAM: PORTABLE CHEST 1 VIEW COMPARISON:  Chest radiograph 05/04/2019 FINDINGS: Heart size within normal limits.  Aortic atherosclerosis. Hazy opacity within the left mid and upper lung field. Redemonstrated left basilar pleuroparenchymal scarring. Please note the patient's chin partially obscures the right lung apex. No evidence of pneumothorax. No acute bony abnormality. Cervical spinal fusion hardware. Overlying cardiac monitoring leads. IMPRESSION: Hazy opacity within the left mid and upper lung field, suspicious for pneumonia given provided history. Redemonstrated left basilar pleuroparenchymal scarring. Aortic atherosclerosis. Electronically Signed   By: Kellie Simmering DO   On: 05/08/2019 07:50   DG Chest [place] 1 View  Result Date: 05/04/2019 CLINICAL DATA:  Follow-up pneumonia due to COVID-19 EXAM: PORTABLE CHEST 1 VIEW COMPARISON:  05/02/2019 FINDINGS: Normal heart size. Aortic atherosclerosis the lungs are hyperinflated and there are coarsened interstitial markings of COPD/emphysema. Blunting of the left costophrenic angle is noted likely reflecting pleuroparenchymal scarring. No airspace consolidation. IMPRESSION: 1. No acute  cardiopulmonary abnormalities. 2. Chronic interstitial coarsening and left base pleuroparenchymal scarring. Electronically Signed   By: Kerby Moors M.D.   On: 05/04/2019 11:38   DG Chest Port 1 View  Result Date: 05/02/2019 CLINICAL DATA:  Shortness of breath with cough and fever EXAM: PORTABLE CHEST 1 VIEW COMPARISON:  July 21, 2018 FINDINGS: There is mild bibasilar atelectasis. There is no edema or consolidation. Heart is upper normal in size with pulmonary vascularity normal. No adenopathy. There is aortic atherosclerosis. There is postoperative change in the lower cervical region. IMPRESSION: Bibasilar atelectasis. No edema or consolidation. Heart upper normal in size. No adenopathy. Aortic Atherosclerosis (ICD10-I70.0). Electronically Signed   By: Lowella Grip III M.D.   On: 05/02/2019 13:36   [place] EKG SITE RITE  Result Date: 05/08/2019 If Site Rite image not attached, placement could not be confirmed due to current cardiac rhythm.

## 2019-05-10 NOTE — Evaluation (Signed)
Clinical/Bedside Swallow Evaluation Patient Details  Name: Joyce Ortiz MRN: [id number] Date of Birth: April 11, 1941  Today's Date: 05/10/2019 Time: SLP Start Time (ACUTE ONLY): 0910 SLP Stop Time (ACUTE ONLY): 0920 SLP Time Calculation (min) (ACUTE ONLY): 10 min  Past Medical History:  Past Medical History:  Diagnosis Date  . Atrial fibrillation ([place])   . CHF (congestive heart failure) ([place])   . COPD (chronic obstructive pulmonary disease) ([place])   . Diabetes mellitus type 2, controlled ([place])   . Hypertension   . Renal disorder   . Respiratory failure, chronic ([place])   . Stroke [facility])    Past Surgical History: No past surgical history on file. HPI:  Pt is a 78 y.o. female recently admitted to [facility] 05/02/19-05/07/19 with d/c back to ALF, now readmitted 05/08/19 with respiratory distress and AMS; worked up for CHF and PNA. PMH includes CVA (residual R-side deficits), CKD IV, HTN, DM, COPD, CHF, dysphagia, afib. CXR Hazy opacity within the left mid and upper lung field, suspicious for pneumonia given provided history.   Assessment / Plan / Recommendation Clinical Impression  Mild dyspnea and cough noted at baseline of pt up in chair. She is endentulous without supplementation of dentures and reports some difficulty swallowing - non specific. Stated she was not hungry/thirsty and had to be encouraged to participate. Mild cough x1 after liquid and sausage patty after rapid mastication and transit. She declined additional bites. Will downgrade texture to Dys 3, continue thin pills with thin. ST will continue to follow briefly    SLP Visit Diagnosis: Dysphagia, unspecified (R13.10)    Aspiration Risk  Mild aspiration risk    Diet Recommendation Dysphagia 3 (Mech soft);Thin liquid   Liquid Administration via: Cup;Straw Medication Administration: Whole meds with liquid Supervision: Patient able to self feed Compensations: Slow rate;Small sips/bites Postural Changes: Seated upright at 90 degrees     Other  Recommendations Oral Care Recommendations: Oral care BID   Follow up Recommendations None      Frequency and Duration min 1 x/week  2 weeks       Prognosis Prognosis for Safe Diet Advancement: Good      Swallow Study   General HPI: Pt is a 78 y.o. female recently admitted to [facility] 05/02/19-05/07/19 with d/c back to ALF, now readmitted 05/08/19 with respiratory distress and AMS; worked up for CHF and PNA. PMH includes CVA (residual R-side deficits), CKD IV, HTN, DM, COPD, CHF, dysphagia, afib. CXR Hazy opacity within the left mid and upper lung field, suspicious for pneumonia given provided history. Type of Study: Bedside Swallow Evaluation Previous Swallow Assessment: (none) Diet Prior to this Study: Regular;Thin liquids Temperature Spikes Noted: No Respiratory Status: Nasal cannula History of Recent Intubation: No Behavior/Cognition: Alert;Requires cueing;Cooperative Oral Cavity Assessment: Within Functional Limits Oral Care Completed by SLP: No Oral Cavity - Dentition: Edentulous Vision: Functional for self-feeding Self-Feeding Abilities: Able to feed self Patient Positioning: Upright in chair Baseline Vocal Quality: Normal Volitional Cough: Strong Volitional Swallow: Able to elicit    Oral/Motor/Sensory Function Overall Oral Motor/Sensory Function: Within functional limits   Ice Chips Ice chips: Within functional limits   Thin Liquid Thin Liquid: Impaired Presentation: Straw Pharyngeal  Phase Impairments: Cough - Delayed    Nectar Thick Nectar Thick Liquid: Not tested   Honey Thick Honey Thick Liquid: Not tested   Puree Puree: Not tested   Solid     Solid: Impaired Pharyngeal Phase Impairments: Cough - Immediate      Houston Siren 05/10/2019,9:37  AM  Orbie Pyo Colvin Caroli.Ed Risk analyst [phone] Office [phone]

## 2019-05-10 NOTE — Plan of Care (Signed)
  Problem: Education: Goal: Knowledge of General Education information will improve Description Including pain rating scale, medication(s)/side effects and non-pharmacologic comfort measures Outcome: Progressing   

## 2019-05-10 NOTE — Progress Notes (Signed)
  Echocardiogram 2D Echocardiogram has been performed.  Joyce Ortiz 05/10/2019, 5:36 PM

## 2019-05-11 LAB — MAGNESIUM: Magnesium: 2 mg/dL (ref 1.7–2.4)

## 2019-05-11 LAB — CBC WITH DIFFERENTIAL/PLATELET
Abs Immature Granulocytes: 0.31 10*3/uL — ABNORMAL HIGH (ref 0.00–0.07)
Basophils Absolute: 0.1 10*3/uL (ref 0.0–0.1)
Basophils Relative: 0 %
Eosinophils Absolute: 0 10*3/uL (ref 0.0–0.5)
Eosinophils Relative: 0 %
HCT: 28.5 % — ABNORMAL LOW (ref 36.0–46.0)
Hemoglobin: 10.3 g/dL — ABNORMAL LOW (ref 12.0–15.0)
Immature Granulocytes: 2 %
Lymphocytes Relative: 3 %
Lymphs Abs: 0.5 10*3/uL — ABNORMAL LOW (ref 0.7–4.0)
MCH: 34.6 pg — ABNORMAL HIGH (ref 26.0–34.0)
MCHC: 36.1 g/dL — ABNORMAL HIGH (ref 30.0–36.0)
MCV: 95.6 fL (ref 80.0–100.0)
Monocytes Absolute: 0.8 10*3/uL (ref 0.1–1.0)
Monocytes Relative: 5 %
Neutro Abs: 14.6 10*3/uL — ABNORMAL HIGH (ref 1.7–7.7)
Neutrophils Relative %: 90 %
Platelets: 220 10*3/uL (ref 150–400)
RBC: 2.98 MIL/uL — ABNORMAL LOW (ref 3.87–5.11)
RDW: 16.7 % — ABNORMAL HIGH (ref 11.5–15.5)
WBC: 16.2 10*3/uL — ABNORMAL HIGH (ref 4.0–10.5)
nRBC: 0 % (ref 0.0–0.2)

## 2019-05-11 LAB — GLUCOSE, CAPILLARY
Glucose-Capillary: 141 mg/dL — ABNORMAL HIGH (ref 70–99)
Glucose-Capillary: 155 mg/dL — ABNORMAL HIGH (ref 70–99)
Glucose-Capillary: 184 mg/dL — ABNORMAL HIGH (ref 70–99)
Glucose-Capillary: 197 mg/dL — ABNORMAL HIGH (ref 70–99)

## 2019-05-11 LAB — COMPREHENSIVE METABOLIC PANEL
ALT: 33 U/L (ref 0–44)
AST: 20 U/L (ref 15–41)
Albumin: 2.4 g/dL — ABNORMAL LOW (ref 3.5–5.0)
Alkaline Phosphatase: 61 U/L (ref 38–126)
Anion gap: 12 (ref 5–15)
BUN: 50 mg/dL — ABNORMAL HIGH (ref 8–23)
CO2: 28 mmol/L (ref 22–32)
Calcium: 8.2 mg/dL — ABNORMAL LOW (ref 8.9–10.3)
Chloride: 99 mmol/L (ref 98–111)
Creatinine, Ser: 1.63 mg/dL — ABNORMAL HIGH (ref 0.44–1.00)
GFR calc Af Amer: 34 mL/min — ABNORMAL LOW (ref >60)
GFR calc non Af Amer: 30 mL/min — ABNORMAL LOW (ref >60)
Glucose, Bld: 157 mg/dL — ABNORMAL HIGH (ref 70–99)
Potassium: 3.9 mmol/L (ref 3.5–5.1)
Sodium: 139 mmol/L (ref 135–145)
Total Bilirubin: 0.8 mg/dL (ref 0.3–1.2)
Total Protein: 6 g/dL — ABNORMAL LOW (ref 6.5–8.1)

## 2019-05-11 LAB — ABO/RH: ABO/RH(D): A POS

## 2019-05-11 LAB — D-DIMER, QUANTITATIVE: D-Dimer, Quant: 1.85 ug/mL-FEU — ABNORMAL HIGH (ref 0.00–0.50)

## 2019-05-11 LAB — C-REACTIVE PROTEIN: CRP: 11.7 mg/dL — ABNORMAL HIGH (ref <1.0)

## 2019-05-11 LAB — PROCALCITONIN: Procalcitonin: 14.4 ng/mL

## 2019-05-11 LAB — BRAIN NATRIURETIC PEPTIDE: B Natriuretic Peptide: 215.8 pg/mL — ABNORMAL HIGH (ref 0.0–100.0)

## 2019-05-11 MED ORDER — POTASSIUM CHLORIDE CRYS ER 20 MEQ PO TBCR
40.0000 meq | EXTENDED_RELEASE_TABLET | Freq: Once | ORAL | Status: AC
  Administered 2019-05-11: 40 meq via ORAL
  Filled 2019-05-11: qty 2

## 2019-05-11 MED ORDER — FUROSEMIDE 10 MG/ML IJ SOLN
40.0000 mg | Freq: Once | INTRAMUSCULAR | Status: AC
  Administered 2019-05-11: 40 mg via INTRAVENOUS
  Filled 2019-05-11: qty 4

## 2019-05-11 NOTE — Progress Notes (Addendum)
PROGRESS NOTE                                                                                                                                                                                                             Patient Demographics:    Joyce Ortiz, is a 78 y.o. female, DOB - [birth year], FQ:[id number]  Outpatient Primary MD for the patient is Bonnita Nasuti, MD    LOS - 3  Admit date - 05/08/2019    Chief Complaint  Patient presents with  . Respiratory Distress       Brief Narrative   Joyce Ortiz is a 78 y.o. female with medical history significant of CVA; stage IV CKD; HTN; DM; COPD; CHF (uncertain systolic vs. Diastolic); chronic debility and dysphagia (has refused SLP evaluation or modification of diet); and afib (off Eliquis due to GI bleed in 04/2018) presenting with respiratory distress.  She was previously admitted at [facility] from 12/3-8 with  COVID-19-associated PNA, she was readmitted with hypoxia most likely due to superimposed bacterial pneumonia and CHF.   Subjective:   Patient in bed, appears comfortable, denies any headache, no fever, no chest pain or pressure, mild cough and improved shortness of breath , no abdominal pain. No focal weakness.   Assessment  & Plan :     1. 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  She has profound leukocytosis, elevated procalcitonin along with high proBNP.  She has been placed on appropriate antibiotics, blood cultures negative, continue IV Lasix.  Monitor closely.  Note she uses 2 L nasal cannula at baseline.  I suspect that she is intermittently aspirating under the effect of high-dose narcotics which she takes at home.  Speech following and currently on dysphagia 3 diet.  Encouraged the  patient to sit up in chair in the daytime use I-S and flutter valve for pulmonary toiletry and then prone in bed when at night.  Pharyngeal suction as needed will be used.  SpO2: 97 % O2 Flow Rate (L/min): 3 L/min  Recent Labs  Lab 05/05/19 0111 05/08/19 0828 05/09/19 0515 05/09/19 0719 05/10/19 0407 05/11/19 0415  CRP 6.4* 8.4* 27.2*  --  23.2* 11.7*  DDIMER 0.96* 2.43* 1.69*  --  2.63* 1.85*  FERRITIN 132 112 183  --   --   --   BNP  --  262.6*  --   --  178.7* 215.8*  PROCALCITON  --  6.57  --  30.52 25.70 14.40    2.  AKI on CKD 4.  Baseline creatinine close to 2.  Gently diurese and monitor.  3.  COPD.  On 2 L nasal cannula home oxygen.  Supportive care.  No extra wheezing.  She is to be chronically on steroids.  4.  Acute on chronic CHF.  Type unknown no echo on file, continue IV Lasix, get echo.  5.  Hypertension.  On Norvasc and labetalol, added hydralazine.  6.  GERD.  On PPI continue.  7.  History of paroxysmal atrial fibrillation.  Mali vas 2 score of greater than 3.  On labetalol, off anticoagulation due to GI bleed.  8.  Dyslipidemia.  Statin.  9.  History of dysphagia.  With new pneumonia speech will be requested to evaluate, patient has refused evaluation in the past.  10.  Anxiety and depression.  On home medications.  11.  Arthritis.  On methotrexate.  Hold narcotics.  Ultram ordered instead.  12.  Chronic pain and high-dose narcotic use at home.  Minimize.  Could be the underlying issue causing her to aspirate.   13.  Steroid-induced hyperglycemia.  Sliding scale.  Lab Results  Component Value Date   HGBA1C 6.3 (H) 05/02/2019   CBG (last 3)  Recent Labs    05/10/19 1543 05/10/19 2134 05/11/19 0853  GLUCAP 177* 167* 141*       Condition -  Guarded  Family Communication  : Daughter called on 05/09/2019 and updated over her cell phone, 05/11/19  Code Status :  DNR  Diet :   Diet Order            DIET DYS 3 Room service appropriate?  Yes; Fluid consistency: Thin  Diet effective now               Disposition Plan  :  PCU  Consults  :  None  Procedures  :     PUD Prophylaxis : PPI  DVT Prophylaxis  :  Heparin   Lab Results  Component Value Date   PLT 220 05/11/2019    Inpatient Medications  Scheduled Meds: . amLODipine  10 mg Oral Daily  . artificial tears   Both Eyes BID  . atorvastatin  40 mg Oral Daily  . brimonidine  1 drop Both Eyes TID  . Chlorhexidine Gluconate Cloth  6 each Topical Daily  . doxycycline  100 mg Oral Q12H  . DULoxetine  60 mg Oral Daily  . escitalopram  5 mg Oral Daily  . folic acid  1 mg Oral Daily  . gabapentin  100 mg Oral BID  . heparin injection (subcutaneous)  5,000 Units Subcutaneous Q8H  . hydrALAZINE  50 mg Oral Q8H  . insulin aspart  0-5 Units Subcutaneous QHS  . insulin aspart  0-9 Units Subcutaneous TID WC  . labetalol  50 mg Oral BID  . latanoprost  1 drop Both Eyes QHS  . methylPREDNISolone (SOLU-MEDROL) injection  30 mg Intravenous Daily  . pantoprazole  40 mg Oral Daily  . sodium chloride flush  10-40 mL Intracatheter Q12H   Continuous Infusions: . ampicillin-sulbactam (UNASYN) IV 1.5 g (05/11/19 0604)   PRN Meds:.acetaminophen, bisacodyl, lip balm, [DISCONTINUED]  ondansetron **OR** ondansetron (ZOFRAN) IV, polyethylene glycol, sodium phosphate, traMADol  Antibiotics  :    Anti-infectives (From admission, onward)   Start     Dose/Rate Route Frequency Ordered Stop   05/09/19 1800  ampicillin-sulbactam (UNASYN) 1.5 g in sodium chloride 0.9 % 100 mL IVPB     1.5 g 200 mL/hr over 30 Minutes Intravenous Every 12 hours 05/09/19 1111     05/09/19 1030  doxycycline (VIBRA-TABS) tablet 100 mg     100 mg Oral Every 12 hours 05/09/19 0952     05/08/19 1415  cefTRIAXone (ROCEPHIN) 2 g in sodium chloride 0.9 % 100 mL IVPB  Status:  Discontinued     2 g 200 mL/hr over 30 Minutes Intravenous Every 24 hours 05/08/19 1411 05/09/19 0719   05/08/19 1415  azithromycin  (ZITHROMAX) 500 mg in sodium chloride 0.9 % 250 mL IVPB  Status:  Discontinued     500 mg 250 mL/hr over 60 Minutes Intravenous Every 24 hours 05/08/19 1411 05/09/19 0719       Time Spent in minutes  30   Lala Lund M.D on 05/11/2019 at 11:36 AM  To page go to [email or web address] - password [email or web address]  [facility] -  Office  [phone]   See all Orders from today for further details    Objective:   Vitals:   05/10/19 2359 05/11/19 0335 05/11/19 0555 05/11/19 0759  BP: 137/71 (!) 124/96 (!) 148/64 135/66  Pulse: (!) 59 63  66  Resp: 17 (!) 21  16  Temp: 98.2 F (36.8 C) 98.4 F (36.9 C)  97.9 F (36.6 C)  TempSrc: Oral Oral  Oral  SpO2: 96% 93%  97%  Weight:      Height:        Wt Readings from Last 3 Encounters:  05/09/19 65.5 kg  05/05/19 67.8 kg     Intake/Output Summary (Last 24 hours) at 05/11/2019 1136 Last data filed at 05/11/2019 0900 Gross per 24 hour  Intake 532.26 ml  Output 300 ml  Net 232.26 ml     Physical Exam  Awake Alert,   No new F.N deficits, Normal affect Mertens.AT,PERRAL Supple Neck,No JVD, No cervical lymphadenopathy appriciated.  Symmetrical Chest wall movement, Good air movement bilaterally, Coarse B sounds RRR,No Gallops, Rubs or new Murmurs, No Parasternal Heave +ve B.Sounds, Abd Soft, No tenderness, No organomegaly appriciated, No rebound - guarding or rigidity. No Cyanosis, Clubbing or edema, No new Rash or bruise     Data Review:    CBC Recent Labs  Lab 05/05/19 0111 05/08/19 0725 05/08/19 0828 05/09/19 0515 05/10/19 0407 05/11/19 0415  WBC 9.9  --  30.4* 32.1* 25.4* 16.2*  HGB 11.4* 13.3 13.8 11.9* 10.5* 10.3*  HCT 35.3* 39.0 40.7 35.5* 29.8* 28.5*  PLT 173  --  213 223 218 220  MCV 96.4  --  97.6 96.2 95.5 95.6  MCH 31.1  --  33.1 32.2 33.7 34.6*  MCHC 32.3  --  33.9 33.5 35.2 36.1*  RDW 17.1*  --  16.9* 17.1* 17.0* 16.7*  LYMPHSABS 0.5*  --  0.9 0.6* 0.5* 0.5*  MONOABS 1.1*  --  1.9* 0.6 1.3* 0.8  EOSABS  0.0  --  1.9* 0.0 0.0 0.0  BASOSABS 0.0  --  0.0 0.0 0.1 0.1    Chemistries  Recent Labs  Lab 05/05/19 0111 05/07/19 0608 05/08/19 0725 05/08/19 0828 05/09/19 0515 05/10/19 0407 05/11/19 0415  NA 140 139 139 140 142 137 139  K  4.3 4.0 3.4* 4.4 3.8 3.4* 3.9  CL 98 99  --  102 98 96* 99  CO2 29 28  --  22 28 27 28   GLUCOSE 131* 171*  --  93 191* 134* 157*  BUN 60* 58*  --  50* 56* 53* 50*  CREATININE 2.00* 1.66*  --  1.94* 2.21* 1.83* 1.63*  CALCIUM 8.2* 8.4*  --  8.6* 8.3* 8.1* 8.2*  MG 2.1  --   --   --  1.7 1.8 2.0  AST 38 30  --   --  19 19 20   ALT 31 40  --   --  30 28 33  ALKPHOS 50 53  --   --  52 64 61  BILITOT 0.7 0.7  --   --  0.6 0.7 0.8   ------------------------------------------------------------------------------------------------------------------ No results for input(s): CHOL, HDL, LDLCALC, TRIG, CHOLHDL, LDLDIRECT in the last 72 hours.  Lab Results  Component Value Date   HGBA1C 6.3 (H) 05/02/2019   ------------------------------------------------------------------------------------------------------------------ No results for input(s): TSH, T4TOTAL, T3FREE, THYROIDAB in the last 72 hours.  Invalid input(s): FREET3  Cardiac Enzymes No results for input(s): CKMB, TROPONINI, MYOGLOBIN in the last 168 hours.  Invalid input(s): CK ------------------------------------------------------------------------------------------------------------------    Component Value Date/Time   BNP 215.8 (H) 05/11/2019 0415    Micro Results Recent Results (from the past 240 hour(s))  Blood culture (routine x 2)     Status: None   Collection Time: 05/02/19  2:28 PM   Specimen: BLOOD  Result Value Ref Range Status   Specimen Description BLOOD RIGHT ANTECUBITAL  Final   Special Requests   Final    BOTTLES DRAWN AEROBIC AND ANAEROBIC Blood Culture adequate volume   Culture   Final    NO GROWTH 5 DAYS Performed at [facility], [address], [place], [place]  [address]    Report Status 05/07/2019 FINAL  Final  Blood culture (routine x 2)     Status: Abnormal   Collection Time: 05/02/19  2:30 PM   Specimen: BLOOD  Result Value Ref Range Status   Specimen Description BLOOD LEFT ANTECUBITAL  Final   Special Requests   Final    BOTTLES DRAWN AEROBIC ONLY Blood Culture results may not be optimal due to an inadequate volume of blood received in culture bottles   Culture  Setup Time   Final    GRAM POSITIVE COCCI IN CLUSTERS AEROBIC BOTTLE ONLY CRITICAL RESULT CALLED TO, READ BACK BY AND VERIFIED WITH: T. MEYER,PHARMD 2311 05/04/2019 T. TYSOR    Culture (A)  Final    STAPHYLOCOCCUS SPECIES (COAGULASE NEGATIVE) THE SIGNIFICANCE OF ISOLATING THIS ORGANISM FROM A SINGLE SET OF BLOOD CULTURES WHEN MULTIPLE SETS ARE DRAWN IS UNCERTAIN. PLEASE NOTIFY THE MICROBIOLOGY DEPARTMENT WITHIN ONE WEEK IF SPECIATION AND SENSITIVITIES ARE REQUIRED. Performed at [facility], [place] [address] [place], [place] [address]    Report Status 05/06/2019 FINAL  Final  Blood Culture (routine x 2)     Status: None (Preliminary result)   Collection Time: 05/08/19  8:05 AM   Specimen: BLOOD  Result Value Ref Range Status   Specimen Description BLOOD RIGHT ANTECUBITAL  Final   Special Requests   Final    BOTTLES DRAWN AEROBIC AND ANAEROBIC Blood Culture adequate volume   Culture   Final    NO GROWTH 3 DAYS Performed at [facility], [place] [address], [place], [place] [address]    Report Status PENDING  Incomplete  Blood Culture (routine x 2)     Status: None (Preliminary result)   Collection Time: 05/08/19  8:28 AM   Specimen: BLOOD RIGHT FOREARM  Result Value Ref Range Status   Specimen Description BLOOD RIGHT FOREARM  Final   Special Requests   Final    BOTTLES DRAWN AEROBIC ONLY Blood Culture results may not be optimal due to an inadequate volume of blood received in culture bottles   Culture   Final    NO GROWTH 3 DAYS Performed at [facility],  [place] [address] [place], [place] [address]    Report Status PENDING  Incomplete  MRSA PCR Screening     Status: Abnormal   Collection Time: 05/09/19  6:37 AM  Result Value Ref Range Status   MRSA by PCR POSITIVE (A) NEGATIVE Final    Comment:        The GeneXpert MRSA Assay (FDA approved for NASAL specimens only), is one component of a comprehensive MRSA colonization surveillance program. It is not intended to diagnose MRSA infection nor to guide or monitor treatment for MRSA infections. RESULT CALLED TO, READ BACK BY AND VERIFIED WITH: Ambrose Pancoast. RN @1812  ON 12.10.2020 BY COHEN,K Performed at [facility], [place] [address], [place], [place] [address]     Radiology Reports DG Chest [place] 1 View  Result Date: 05/08/2019 CLINICAL DATA:  Shortness of breath, respiratory distress, COVID (+), EXAM: PORTABLE CHEST 1 VIEW COMPARISON:  Chest radiograph 05/04/2019 FINDINGS: Heart size within normal limits.  Aortic atherosclerosis. Hazy opacity within the left mid and upper lung field. Redemonstrated left basilar pleuroparenchymal scarring. Please note the patient's chin partially obscures the right lung apex. No evidence of pneumothorax. No acute bony abnormality. Cervical spinal fusion hardware. Overlying cardiac monitoring leads. IMPRESSION: Hazy opacity within the left mid and upper lung field, suspicious for pneumonia given provided history. Redemonstrated left basilar pleuroparenchymal scarring. Aortic atherosclerosis. Electronically Signed   By: Kellie Simmering DO   On: 05/08/2019 07:50   DG Chest Port 1 View  Result Date: 05/04/2019 CLINICAL DATA:  Follow-up pneumonia due to COVID-19 EXAM: PORTABLE CHEST 1 VIEW COMPARISON:  05/02/2019 FINDINGS: Normal heart size. Aortic atherosclerosis the lungs are hyperinflated and there are coarsened interstitial markings of COPD/emphysema. Blunting of the left costophrenic angle is noted likely reflecting pleuroparenchymal scarring. No  airspace consolidation. IMPRESSION: 1. No acute cardiopulmonary abnormalities. 2. Chronic interstitial coarsening and left base pleuroparenchymal scarring. Electronically Signed   By: Kerby Moors M.D.   On: 05/04/2019 11:38   DG Chest Port 1 View  Result Date: 05/02/2019 CLINICAL DATA:  Shortness of breath with cough and fever EXAM: PORTABLE CHEST 1 VIEW COMPARISON:  July 21, 2018 FINDINGS: There is mild bibasilar atelectasis. There is no edema or consolidation. Heart is upper normal in size with pulmonary vascularity normal. No adenopathy. There is aortic atherosclerosis. There is postoperative change in the lower cervical region. IMPRESSION: Bibasilar atelectasis. No edema or consolidation. Heart upper normal in size. No adenopathy. Aortic Atherosclerosis (ICD10-I70.0). Electronically Signed   By: Lowella Grip III M.D.   On: 05/02/2019 13:36   [place] EKG SITE RITE  Result Date: 05/08/2019 If Site Rite image not attached, placement could not be confirmed due to current cardiac rhythm.

## 2019-05-12 ENCOUNTER — Encounter (HOSPITAL_COMMUNITY): Payer: Self-pay | Admitting: Internal Medicine

## 2019-05-12 LAB — CBC WITH DIFFERENTIAL/PLATELET
Abs Immature Granulocytes: 0.53 10*3/uL — ABNORMAL HIGH (ref 0.00–0.07)
Basophils Absolute: 0.1 10*3/uL (ref 0.0–0.1)
Basophils Relative: 1 %
Eosinophils Absolute: 0 10*3/uL (ref 0.0–0.5)
Eosinophils Relative: 0 %
HCT: 27.7 % — ABNORMAL LOW (ref 36.0–46.0)
Hemoglobin: 10.4 g/dL — ABNORMAL LOW (ref 12.0–15.0)
Immature Granulocytes: 4 %
Lymphocytes Relative: 4 %
Lymphs Abs: 0.6 10*3/uL — ABNORMAL LOW (ref 0.7–4.0)
MCH: 36.1 pg — ABNORMAL HIGH (ref 26.0–34.0)
MCHC: 37.5 g/dL — ABNORMAL HIGH (ref 30.0–36.0)
MCV: 96.2 fL (ref 80.0–100.0)
Monocytes Absolute: 1.2 10*3/uL — ABNORMAL HIGH (ref 0.1–1.0)
Monocytes Relative: 8 %
Neutro Abs: 11.6 10*3/uL — ABNORMAL HIGH (ref 1.7–7.7)
Neutrophils Relative %: 83 %
Platelets: 234 10*3/uL (ref 150–400)
RBC: 2.88 MIL/uL — ABNORMAL LOW (ref 3.87–5.11)
RDW: 16.5 % — ABNORMAL HIGH (ref 11.5–15.5)
WBC: 14 10*3/uL — ABNORMAL HIGH (ref 4.0–10.5)
nRBC: 0 % (ref 0.0–0.2)

## 2019-05-12 LAB — GLUCOSE, CAPILLARY
Glucose-Capillary: 145 mg/dL — ABNORMAL HIGH (ref 70–99)
Glucose-Capillary: 163 mg/dL — ABNORMAL HIGH (ref 70–99)
Glucose-Capillary: 223 mg/dL — ABNORMAL HIGH (ref 70–99)
Glucose-Capillary: 229 mg/dL — ABNORMAL HIGH (ref 70–99)

## 2019-05-12 LAB — COMPREHENSIVE METABOLIC PANEL
ALT: 38 U/L (ref 0–44)
AST: 24 U/L (ref 15–41)
Albumin: 2.5 g/dL — ABNORMAL LOW (ref 3.5–5.0)
Alkaline Phosphatase: 59 U/L (ref 38–126)
Anion gap: 13 (ref 5–15)
BUN: 53 mg/dL — ABNORMAL HIGH (ref 8–23)
CO2: 27 mmol/L (ref 22–32)
Calcium: 8.9 mg/dL (ref 8.9–10.3)
Chloride: 101 mmol/L (ref 98–111)
Creatinine, Ser: 1.7 mg/dL — ABNORMAL HIGH (ref 0.44–1.00)
GFR calc Af Amer: 33 mL/min — ABNORMAL LOW (ref >60)
GFR calc non Af Amer: 28 mL/min — ABNORMAL LOW (ref >60)
Glucose, Bld: 176 mg/dL — ABNORMAL HIGH (ref 70–99)
Potassium: 4.5 mmol/L (ref 3.5–5.1)
Sodium: 141 mmol/L (ref 135–145)
Total Bilirubin: 0.8 mg/dL (ref 0.3–1.2)
Total Protein: 6.2 g/dL — ABNORMAL LOW (ref 6.5–8.1)

## 2019-05-12 LAB — D-DIMER, QUANTITATIVE: D-Dimer, Quant: 1.44 ug/mL-FEU — ABNORMAL HIGH (ref 0.00–0.50)

## 2019-05-12 LAB — MAGNESIUM: Magnesium: 2.1 mg/dL (ref 1.7–2.4)

## 2019-05-12 LAB — ECHOCARDIOGRAM COMPLETE
Height: 62 in
Weight: 2310.42 oz

## 2019-05-12 LAB — C-REACTIVE PROTEIN: CRP: 5.9 mg/dL — ABNORMAL HIGH (ref <1.0)

## 2019-05-12 LAB — BRAIN NATRIURETIC PEPTIDE: B Natriuretic Peptide: 281.2 pg/mL — ABNORMAL HIGH (ref 0.0–100.0)

## 2019-05-12 MED ORDER — FUROSEMIDE 10 MG/ML IJ SOLN
60.0000 mg | Freq: Once | INTRAMUSCULAR | Status: AC
  Administered 2019-05-12: 60 mg via INTRAVENOUS
  Filled 2019-05-12: qty 6

## 2019-05-12 MED ORDER — METHYLPREDNISOLONE SODIUM SUCC 40 MG IJ SOLR
20.0000 mg | Freq: Every day | INTRAMUSCULAR | Status: DC
  Administered 2019-05-13: 20 mg via INTRAVENOUS
  Filled 2019-05-12: qty 1

## 2019-05-12 MED ORDER — OXYCODONE HCL 5 MG PO TABS
2.5000 mg | ORAL_TABLET | Freq: Three times a day (TID) | ORAL | Status: DC | PRN
  Administered 2019-05-12: 2.5 mg via ORAL
  Filled 2019-05-12: qty 1

## 2019-05-12 MED ORDER — PNEUMOCOCCAL VAC POLYVALENT 25 MCG/0.5ML IJ INJ
0.5000 mL | INJECTION | INTRAMUSCULAR | Status: AC
  Administered 2019-05-13: 0.5 mL via INTRAMUSCULAR
  Filled 2019-05-12: qty 0.5

## 2019-05-12 NOTE — Progress Notes (Signed)
Pharmacy Antibiotic Note  Joyce Ortiz is a 78 y.o. female admitted on 05/08/2019 with respiratory distress possibly due to superimposed bacterial PNA and CHF.  Pharmacy has been consulted for Unasyn dosing. Patient is also ordered doxycycline.   Today is D#4 ABX. White count and CRP all trending down, renal function stable, blood cultures no growth thus far.  Plan: -Continue Unasyn 1.5 g IV q12h -F/U renal function, culture results, plans for antibiotics -Recommend stopping antibiotics after 5 total days   Height: 5\' 2"  (157.5 cm) Weight: 144 lb 6.4 oz (65.5 kg) IBW/kg (Calculated) : 50.1  Temp (24hrs), Avg:98 F (36.7 C), Min:97.6 F (36.4 C), Max:98.4 F (36.9 C)  Recent Labs  Lab 05/08/19 0828 05/09/19 0515 05/10/19 0407 05/11/19 0415 05/12/19 0152  WBC 30.4* 32.1* 25.4* 16.2* 14.0*  CREATININE 1.94* 2.21* 1.83* 1.63* 1.70*  LATICACIDVEN 1.9  --   --   --   --     Estimated Creatinine Clearance: 23.8 mL/min (A) (by C-G formula based on SCr of 1.7 mg/dL (H)).    Allergies  Allergen Reactions  . Aspirin     Other reaction(s): GI Upset (intolerance)  . Biaxin [Clarithromycin] Other (See Comments)    [facility] taken [facility] in [facility]   . Codeine Nausea And Vomiting  . Moxifloxacin Itching    Antimicrobials this admission: 12/9 azithromycin/ceftriaxone x 1 12/10 Unasyn >>  12/10 doxycycline >>  Microbiology results: 12/9 BCx: ngtd 12/10 MRSA PCR: positive  Thank you for allowing pharmacy to be a part of this patient's care.  Arrie Senate, PharmD, BCPS Clinical Pharmacist Please check AMION for all [facility] Pharmacy numbers 05/12/2019

## 2019-05-12 NOTE — Progress Notes (Signed)
PROGRESS NOTE                                                                                                                                                                                                             Patient Demographics:    Joyce Ortiz, is a 78 y.o. female, DOB - February 27, 1941, [id number]  Outpatient Primary MD for the patient is Bonnita Nasuti, MD    LOS - 4  Admit date - 05/08/2019    Chief Complaint  Patient presents with   Respiratory Distress       Brief Narrative   Joyce Ortiz is a 78 y.o. female with medical history significant of CVA; stage IV CKD; HTN; DM; COPD; CHF (uncertain systolic vs. Diastolic); chronic debility and dysphagia (has refused SLP evaluation or modification of diet); and afib (off Eliquis due to GI bleed in 04/2018) presenting with respiratory distress.  She was previously admitted at [facility] from 12/3-8 with  COVID-19-associated PNA, she was readmitted with hypoxia most likely due to superimposed bacterial pneumonia and CHF.   Subjective:   Patient in bed, appears comfortable, denies any headache, no fever, no chest pain or pressure, no shortness of breath , no abdominal pain. No focal weakness..   Assessment  & Plan :     1. 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  She has profound leukocytosis, elevated procalcitonin along with high proBNP.  She has been placed on appropriate antibiotics, blood cultures negative, continue IV Lasix.  Monitor closely.  Note she uses 2 L nasal cannula at baseline.  I suspect that she is intermittently aspirating under the effect of high-dose narcotics which she takes at home.  Speech following and currently on dysphagia 3 diet.  Encouraged the patient to sit up in  chair in the daytime use I-S and flutter valve for pulmonary toiletry and then prone in bed when at night.  Pharyngeal suction as needed will be used.  SpO2: 98 % O2 Flow Rate (L/min): 2 L/min  Recent Labs  Lab 05/08/19 0828 05/09/19 0515 05/09/19 0719 05/10/19 0407 05/11/19 0415 05/12/19 0152  CRP 8.4* 27.2*  --  23.2* 11.7* 5.9*  DDIMER 2.43* 1.69*  --  2.63* 1.85* 1.44*  FERRITIN 112 183  --   --   --   --   BNP 262.6*  --   --  178.7* 215.8* 281.2*  PROCALCITON 6.57  --  30.52 25.70 14.40  --     2.  AKI on CKD 4.  Baseline creatinine close to 2.  Gently diurese and monitor.  3.  COPD.  On 2 L nasal cannula home oxygen.  Supportive care.  No extra wheezing.  She seems to be chronically on steroids.  4.  Acute on chronic diastolic CHF EF [id number] on echocardiogram.  Continue IV Lasix and monitor.  Beta-blocker in place.  5.  Hypertension.  On Norvasc and labetalol, added hydralazine.  6.  GERD.  On PPI continue.  7.  History of paroxysmal atrial fibrillation.  Mali vas 2 score of greater than 3.  On labetalol, off anticoagulation due to GI bleed.  8.  Dyslipidemia.  Statin.  9.  History of dysphagia.  With new pneumonia speech will be requested to evaluate, patient has refused evaluation in the past.  10.  Anxiety and depression.  On home medications.  11.  Arthritis.  On methotrexate.  Hold narcotics.  Ultram ordered instead.  12.  Chronic pain and high-dose narcotic use at home.  Minimize.  Could be the underlying issue causing her to aspirate.   13.  Steroid-induced hyperglycemia.  Sliding scale.  Lab Results  Component Value Date   HGBA1C 6.3 (H) 05/02/2019   CBG (last 3)  Recent Labs    05/11/19 1655 05/11/19 2206 05/12/19 0831  GLUCAP 184* 197* 145*       Condition -  Guarded  Family Communication  : Daughter called on 05/09/2019 and updated over her cell phone, 05/11/19 updated in detail, also my concerns about narcotics were relayed to her.  Code  Status :  DNR  Diet :   Diet Order            DIET DYS 3 Room service appropriate? Yes; Fluid consistency: Thin  Diet effective now               Disposition Plan  :  PCU  Consults  :  None  Procedures  :    TTE -    1. Left ventricular ejection fraction, by visual estimation, is 60 to 65%. The left ventricle has normal function. There is moderately increased left ventricular hypertrophy.  2. Elevated left atrial pressure.  3. Left ventricular diastolic parameters are consistent with Grade I diastolic dysfunction (impaired relaxation).  4. The left ventricle has no regional wall motion abnormalities.  5. Global right ventricle has normal systolic function.The right ventricular size is normal. No increase in right ventricular wall thickness.  6. Left atrial size was normal.  7. Right atrial size was normal.  8. The mitral valve is normal in structure. No evidence of mitral valve regurgitation. No evidence of mitral stenosis.  9. The tricuspid valve is not well visualized. Tricuspid valve regurgitation is trivial. 10. The aortic valve is tricuspid. Aortic valve regurgitation is mild. Mild aortic valve stenosis. 11. The pulmonic valve was not well visualized. Pulmonic valve regurgitation is not visualized. 12. Mildly elevated pulmonary artery systolic pressure. 13. The inferior vena cava is normal in size with greater than 50% respiratory variability, suggesting right atrial pressure of 3 mmHg. 14. The interatrial septum was not well visualized.   PUD Prophylaxis : PPI  DVT Prophylaxis  :  Heparin   Lab Results  Component Value Date   PLT 234 05/12/2019    Inpatient Medications  Scheduled Meds:  amLODipine  10 mg Oral Daily   artificial tears   Both Eyes BID   atorvastatin  40 mg Oral Daily   brimonidine  1 drop Both Eyes TID   Chlorhexidine Gluconate Cloth  6 each Topical Daily   doxycycline  100 mg Oral Q12H   DULoxetine  60 mg Oral Daily   escitalopram   5 mg Oral Daily   folic acid  1 mg Oral Daily   gabapentin  100 mg Oral BID   heparin injection (subcutaneous)  5,000 Units Subcutaneous Q8H   hydrALAZINE  50 mg Oral Q8H   insulin aspart  0-5 Units Subcutaneous QHS   insulin aspart  0-9 Units Subcutaneous TID WC   labetalol  50 mg Oral BID   latanoprost  1 drop Both Eyes QHS   methylPREDNISolone (SOLU-MEDROL) injection  30 mg Intravenous Daily   pantoprazole  40 mg Oral Daily   [START ON 05/13/2019] pneumococcal 23 valent vaccine  0.5 mL Intramuscular Tomorrow-1000   sodium chloride flush  10-40 mL Intracatheter Q12H   Continuous Infusions:  ampicillin-sulbactam (UNASYN) IV 1.5 g (05/12/19 0526)   PRN Meds:.acetaminophen, bisacodyl, lip balm, [DISCONTINUED] ondansetron **OR** ondansetron (ZOFRAN) IV, oxyCODONE, polyethylene glycol, sodium phosphate, traMADol  Antibiotics  :    Anti-infectives (From admission, onward)   Start     Dose/Rate Route Frequency Ordered Stop   05/09/19 1800  ampicillin-sulbactam (UNASYN) 1.5 g in sodium chloride 0.9 % 100 mL IVPB     1.5 g 200 mL/hr over 30 Minutes Intravenous Every 12 hours 05/09/19 1111     05/09/19 1030  doxycycline (VIBRA-TABS) tablet 100 mg     100 mg Oral Every 12 hours 05/09/19 0952     05/08/19 1415  cefTRIAXone (ROCEPHIN) 2 g in sodium chloride 0.9 % 100 mL IVPB  Status:  Discontinued     2 g 200 mL/hr over 30 Minutes Intravenous Every 24 hours 05/08/19 1411 05/09/19 0719   05/08/19 1415  azithromycin (ZITHROMAX) 500 mg in sodium chloride 0.9 % 250 mL IVPB  Status:  Discontinued     500 mg 250 mL/hr over 60 Minutes Intravenous Every 24 hours 05/08/19 1411 05/09/19 0719       Time Spent in minutes  30   Lala Lund M.D on 05/12/2019 at 10:58 AM  To page go to [email or web address] - password [email or web address]  [facility] -  Office  [phone]   See all Orders from today for further details    Objective:   Vitals:   05/11/19 2049 05/12/19 0000 05/12/19 0500  05/12/19 0817  BP: (!) 151/75  (!) 151/68 (!) 155/72  Pulse:  61 65 62  Resp:  16 20 19   Temp:   98.4 F (36.9 C) 98 F (36.7 C)  TempSrc:   Oral Oral  SpO2:  96% 95% 98%  Weight:      Height:        Wt Readings from Last 3 Encounters:  05/09/19 65.5 kg  05/05/19 67.8 kg     Intake/Output Summary (Last 24 hours) at 05/12/2019 1058 Last data filed at 05/12/2019 0600 Gross per 24 hour  Intake 480 ml  Output 1250 ml  Net -770 ml     Physical Exam  Awake Alert,   No new F.N deficits, Normal affect .AT,PERRAL Supple Neck,No JVD, No cervical lymphadenopathy appriciated.  Symmetrical Chest  wall movement, Good air movement bilaterally, few coarse B sounds RRR,No Gallops, Rubs or new Murmurs, No Parasternal Heave +ve B.Sounds, Abd Soft, No tenderness, No organomegaly appriciated, No rebound - guarding or rigidity. No Cyanosis, Clubbing or edema, No new Rash or bruise      Data Review:    CBC Recent Labs  Lab 05/08/19 0828 05/09/19 0515 05/10/19 0407 05/11/19 0415 05/12/19 0152  WBC 30.4* 32.1* 25.4* 16.2* 14.0*  HGB 13.8 11.9* 10.5* 10.3* 10.4*  HCT 40.7 35.5* 29.8* 28.5* 27.7*  PLT 213 223 218 220 234  MCV 97.6 96.2 95.5 95.6 96.2  MCH 33.1 32.2 33.7 34.6* 36.1*  MCHC 33.9 33.5 35.2 36.1* 37.5*  RDW 16.9* 17.1* 17.0* 16.7* 16.5*  LYMPHSABS 0.9 0.6* 0.5* 0.5* 0.6*  MONOABS 1.9* 0.6 1.3* 0.8 1.2*  EOSABS 1.9* 0.0 0.0 0.0 0.0  BASOSABS 0.0 0.0 0.1 0.1 0.1    Chemistries  Recent Labs  Lab 05/07/19 0608 05/08/19 0828 05/09/19 0515 05/10/19 0407 05/11/19 0415 05/12/19 0152  NA 139 140 142 137 139 141  K 4.0 4.4 3.8 3.4* 3.9 4.5  CL 99 102 98 96* 99 101  CO2 28 22 28 27 28 27   GLUCOSE 171* 93 191* 134* 157* 176*  BUN 58* 50* 56* 53* 50* 53*  CREATININE 1.66* 1.94* 2.21* 1.83* 1.63* 1.70*  CALCIUM 8.4* 8.6* 8.3* 8.1* 8.2* 8.9  MG  --   --  1.7 1.8 2.0 2.1  AST 30  --  19 19 20 24   ALT 40  --  30 28 33 38  ALKPHOS 53  --  52 64 61 59  BILITOT  0.7  --  0.6 0.7 0.8 0.8   ------------------------------------------------------------------------------------------------------------------ No results for input(s): CHOL, HDL, LDLCALC, TRIG, CHOLHDL, LDLDIRECT in the last 72 hours.  Lab Results  Component Value Date   HGBA1C 6.3 (H) 05/02/2019   ------------------------------------------------------------------------------------------------------------------ No results for input(s): TSH, T4TOTAL, T3FREE, THYROIDAB in the last 72 hours.  Invalid input(s): FREET3  Cardiac Enzymes No results for input(s): CKMB, TROPONINI, MYOGLOBIN in the last 168 hours.  Invalid input(s): CK ------------------------------------------------------------------------------------------------------------------    Component Value Date/Time   BNP 281.2 (H) 05/12/2019 0152    Micro Results Recent Results (from the past 240 hour(s))  Blood culture (routine x 2)     Status: None   Collection Time: 05/02/19  2:28 PM   Specimen: BLOOD  Result Value Ref Range Status   Specimen Description BLOOD RIGHT ANTECUBITAL  Final   Special Requests   Final    BOTTLES DRAWN AEROBIC AND ANAEROBIC Blood Culture adequate volume   Culture   Final    NO GROWTH 5 DAYS Performed at [facility], [address], [place], [place] [address]    Report Status 05/07/2019 FINAL  Final  Blood culture (routine x 2)     Status: Abnormal   Collection Time: 05/02/19  2:30 PM   Specimen: BLOOD  Result Value Ref Range Status   Specimen Description BLOOD LEFT ANTECUBITAL  Final   Special Requests   Final    BOTTLES DRAWN AEROBIC ONLY Blood Culture results may not be optimal due to an inadequate volume of blood received in culture bottles   Culture  Setup Time   Final    GRAM POSITIVE COCCI IN CLUSTERS AEROBIC BOTTLE ONLY CRITICAL RESULT CALLED TO, READ BACK BY AND VERIFIED WITH: T. MEYER,PHARMD 2311 05/04/2019 T. TYSOR    Culture (A)  Final    STAPHYLOCOCCUS SPECIES  (  COAGULASE NEGATIVE) THE SIGNIFICANCE OF ISOLATING THIS ORGANISM FROM A SINGLE SET OF BLOOD CULTURES WHEN MULTIPLE SETS ARE DRAWN IS UNCERTAIN. PLEASE NOTIFY THE MICROBIOLOGY DEPARTMENT WITHIN ONE WEEK IF SPECIATION AND SENSITIVITIES ARE REQUIRED. Performed at [facility], [place] [address] [place], [place] [address]    Report Status 05/06/2019 FINAL  Final  Blood Culture (routine x 2)     Status: None (Preliminary result)   Collection Time: 05/08/19  8:05 AM   Specimen: BLOOD  Result Value Ref Range Status   Specimen Description BLOOD RIGHT ANTECUBITAL  Final   Special Requests   Final    BOTTLES DRAWN AEROBIC AND ANAEROBIC Blood Culture adequate volume   Culture   Final    NO GROWTH 4 DAYS Performed at [facility], [place] [address] [place], [place] [address]    Report Status PENDING  Incomplete  Blood Culture (routine x 2)     Status: None (Preliminary result)   Collection Time: 05/08/19  8:28 AM   Specimen: BLOOD RIGHT FOREARM  Result Value Ref Range Status   Specimen Description BLOOD RIGHT FOREARM  Final   Special Requests   Final    BOTTLES DRAWN AEROBIC ONLY Blood Culture results may not be optimal due to an inadequate volume of blood received in culture bottles   Culture   Final    NO GROWTH 4 DAYS Performed at [facility], [place] [address] [place], [place] [address]    Report Status PENDING  Incomplete  MRSA PCR Screening     Status: Abnormal   Collection Time: 05/09/19  6:37 AM  Result Value Ref Range Status   MRSA by PCR POSITIVE (A) NEGATIVE Final    Comment:        The GeneXpert MRSA Assay (FDA approved for NASAL specimens only), is one component of a comprehensive MRSA colonization surveillance program. It is not intended to diagnose MRSA infection nor to guide or monitor treatment for MRSA infections. RESULT CALLED TO, READ BACK BY AND VERIFIED WITH: Ambrose Pancoast. RN @1812  ON 12.10.2020 BY COHEN,K Performed at [facility],  [place] [address], [place], [place] [address]     Radiology Reports DG Chest [place] 1 View  Result Date: 05/08/2019 CLINICAL DATA:  Shortness of breath, respiratory distress, COVID (+), EXAM: PORTABLE CHEST 1 VIEW COMPARISON:  Chest radiograph 05/04/2019 FINDINGS: Heart size within normal limits.  Aortic atherosclerosis. Hazy opacity within the left mid and upper lung field. Redemonstrated left basilar pleuroparenchymal scarring. Please note the patient's chin partially obscures the right lung apex. No evidence of pneumothorax. No acute bony abnormality. Cervical spinal fusion hardware. Overlying cardiac monitoring leads. IMPRESSION: Hazy opacity within the left mid and upper lung field, suspicious for pneumonia given provided history. Redemonstrated left basilar pleuroparenchymal scarring. Aortic atherosclerosis. Electronically Signed   By: Kellie Simmering DO   On: 05/08/2019 07:50   DG Chest Port 1 View  Result Date: 05/04/2019 CLINICAL DATA:  Follow-up pneumonia due to COVID-19 EXAM: PORTABLE CHEST 1 VIEW COMPARISON:  05/02/2019 FINDINGS: Normal heart size. Aortic atherosclerosis the lungs are hyperinflated and there are coarsened interstitial markings of COPD/emphysema. Blunting of the left costophrenic angle is noted likely reflecting pleuroparenchymal scarring. No airspace consolidation. IMPRESSION: 1. No acute cardiopulmonary abnormalities. 2. Chronic interstitial coarsening and left base pleuroparenchymal scarring. Electronically Signed   By: Kerby Moors M.D.   On: 05/04/2019 11:38   DG Chest Port 1 View  Result Date: 05/02/2019 CLINICAL DATA:  Shortness  of breath with cough and fever EXAM: PORTABLE CHEST 1 VIEW COMPARISON:  July 21, 2018 FINDINGS: There is mild bibasilar atelectasis. There is no edema or consolidation. Heart is upper normal in size with pulmonary vascularity normal. No adenopathy. There is aortic atherosclerosis. There is postoperative change in the lower cervical region.  IMPRESSION: Bibasilar atelectasis. No edema or consolidation. Heart upper normal in size. No adenopathy. Aortic Atherosclerosis (ICD10-I70.0). Electronically Signed   By: Lowella Grip III M.D.   On: 05/02/2019 13:36   ECHOCARDIOGRAM COMPLETE  Result Date: 05/12/2019   ECHOCARDIOGRAM REPORT   Patient Name:   Joyce Ortiz Date of Exam: 05/10/2019 Medical Rec #:  [id number]    Height:       62.0 in Accession #:    [id number]   Weight:       144.4 lb Date of Birth:  02/26/1941    BSA:          1.66 m Patient Age:    79 years     BP:           105/68 mmHg Patient Gender: F            HR:           64 bpm. Exam Location:  Inpatient Procedure: 2D Echo Indications:    CHF-Acute Diastolic 0000000  History:        Patient has no prior history of Echocardiogram examinations.                 CHF, COPD, Arrythmias:Atrial Fibrillation; Risk                 Factors:Hypertension and Diabetes. Hx CVA. CKD.  Sonographer:    Clayton Lefort RDCS (AE) Referring Phys: [id number] [place]  1. Left ventricular ejection fraction, by visual estimation, is 60 to 65%. The left ventricle has normal function. There is moderately increased left ventricular hypertrophy.  2. Elevated left atrial pressure.  3. Left ventricular diastolic parameters are consistent with Grade I diastolic dysfunction (impaired relaxation).  4. The left ventricle has no regional wall motion abnormalities.  5. Global right ventricle has normal systolic function.The right ventricular size is normal. No increase in right ventricular wall thickness.  6. Left atrial size was normal.  7. Right atrial size was normal.  8. The mitral valve is normal in structure. No evidence of mitral valve regurgitation. No evidence of mitral stenosis.  9. The tricuspid valve is not well visualized. Tricuspid valve regurgitation is trivial. 10. The aortic valve is tricuspid. Aortic valve regurgitation is mild. Mild aortic valve stenosis. 11. The pulmonic valve was not  well visualized. Pulmonic valve regurgitation is not visualized. 12. Mildly elevated pulmonary artery systolic pressure. 13. The inferior vena cava is normal in size with greater than 50% respiratory variability, suggesting right atrial pressure of 3 mmHg. 14. The interatrial septum was not well visualized. FINDINGS  Left Ventricle: Left ventricular ejection fraction, by visual estimation, is 60 to 65%. The left ventricle has normal function. The left ventricle has no regional wall motion abnormalities. There is moderately increased left ventricular hypertrophy. Left ventricular diastolic parameters are consistent with Grade I diastolic dysfunction (impaired relaxation). Elevated left atrial pressure. Right Ventricle: The right ventricular size is normal. No increase in right ventricular wall thickness. Global RV systolic function is has normal systolic function. The tricuspid regurgitant velocity is 2.66 m/s, and with an assumed right atrial pressure  of 8 mmHg, the estimated right ventricular  systolic pressure is mildly elevated at 36.3 mmHg. Left Atrium: Left atrial size was normal in size. Right Atrium: Right atrial size was normal in size Pericardium: There is no evidence of pericardial effusion. Mitral Valve: The mitral valve is normal in structure. No evidence of mitral valve regurgitation. No evidence of mitral valve stenosis by observation. MV peak gradient, 3.9 mmHg. Tricuspid Valve: The tricuspid valve is not well visualized. Tricuspid valve regurgitation is trivial. Aortic Valve: The aortic valve is tricuspid. Aortic valve regurgitation is mild. Aortic regurgitation PHT measures 959 msec. Mild aortic stenosis is present. Mild aortic valve annular calcification. There is mild calcification of the aortic valve. Aortic  valve mean gradient measures 11.2 mmHg. Aortic valve peak gradient measures 23.5 mmHg. Aortic valve area, by VTI measures 1.77 cm. Pulmonic Valve: The pulmonic valve was not well  visualized. Pulmonic valve regurgitation is not visualized. Pulmonic regurgitation is not visualized. No evidence of pulmonic stenosis. Aorta: The aortic root is normal in size and structure. Pulmonary Artery: Indeterminant PASP, inadequate TR jet. Venous: The inferior vena cava is normal in size with greater than 50% respiratory variability, suggesting right atrial pressure of 3 mmHg. IAS/Shunts: The interatrial septum was not well visualized.  LEFT VENTRICLE PLAX 2D LVIDd:         3.39 cm  Diastology LVIDs:         2.27 cm  LV e' lateral:   5.55 cm/s LV PW:         1.28 cm  LV E/e' lateral: 15.8 LV IVS:        1.34 cm  LV e' medial:    5.87 cm/s LVOT diam:     1.90 cm  LV E/e' medial:  14.9 LV SV:         30 ml LV SV Index:   17.30 LVOT Area:     2.84 cm  RIGHT VENTRICLE          IVC RV Basal diam:  2.44 cm  IVC diam: 1.86 cm LEFT ATRIUM             Index       RIGHT ATRIUM           Index LA diam:        3.00 cm 1.80 cm/m  RA Area:     11.00 cm LA Vol (A2C):   47.6 ml 28.60 ml/m RA Volume:   22.30 ml  13.40 ml/m LA Vol (A4C):   43.8 ml 26.31 ml/m LA Biplane Vol: 48.2 ml 28.96 ml/m  AORTIC VALVE AV Area (Vmax):    1.54 cm AV Area (Vmean):   1.68 cm AV Area (VTI):     1.77 cm AV Vmax:           242.40 cm/s AV Vmean:          154.200 cm/s AV VTI:            0.489 m AV Peak Grad:      23.5 mmHg AV Mean Grad:      11.2 mmHg LVOT Vmax:         132.00 cm/s LVOT Vmean:        91.100 cm/s LVOT VTI:          0.306 m LVOT/AV VTI ratio: 0.63 AI PHT:            959 msec  AORTA Ao Root diam: 2.70 cm Ao Asc diam:  3.30 cm MITRAL VALVE  TRICUSPID VALVE MV Area (PHT): 2.39 cm              TR Peak grad:   28.3 mmHg MV Peak grad:  3.9 mmHg              TR Vmax:        267.00 cm/s MV Mean grad:  1.0 mmHg MV Vmax:       0.99 m/s              SHUNTS MV Vmean:      56.3 cm/s             Systemic VTI:  0.31 m MV VTI:        0.35 m                Systemic Diam: 1.90 cm MV PHT:        91.93 msec MV Decel  Time: 317 msec MV E velocity: 87.50 cm/s  103 cm/s MV A velocity: 126.00 cm/s 70.3 cm/s MV E/A ratio:  0.69        1.5  Carlyle Dolly MD Electronically signed by Carlyle Dolly MD Signature Date/Time: 05/12/2019/9:54:07 AM    Final    [place] EKG SITE RITE  Result Date: 05/08/2019 If Site Rite image not attached, placement could not be confirmed due to current cardiac rhythm.

## 2019-05-12 NOTE — TOC Initial Note (Addendum)
Transition of Care [facility]) - Initial/Assessment Note    Patient Details  Name: Joyce Ortiz MRN: [id number] Date of Birth: [birth year]  Transition of Care [facility]) CM/SW Contact:    Shade Flood, LCSW Phone Number: 05/12/2019, 12:08 PM  Clinical Narrative:                  Pt admitted from [facility]. Pt was hospitalized at [facility] 12/3-12/8. Discharged back to [facility] at [employer]. Returned to [facility] the next day.  Spoke with staff at [facility] today to update. Staff at the ALF state that pt is total care at the facility. She does not ambulate. She can pivot. At baseline, pt is on 2L O2 and is alert and oriented.   TOC will follow and assist with dc planning needs as pt's stay progresses and PT/OT recommendations are made.  Expected Discharge Plan: Assisted Living Barriers to Discharge: Continued Medical Work up   Patient Goals and CMS Choice        Expected Discharge Plan and Services Expected Discharge Plan: Assisted Living       Living arrangements for the past 2 months: Assisted Living Facility                                      Prior Living Arrangements/Services Living arrangements for the past 2 months: [place] Lives with:: Facility Resident Patient language and need for interpreter reviewed:: Yes Do you feel safe going back to the place where you live?: Yes      Need for Family Participation in Patient Care: No (Comment) Care giver support system in place?: Yes (comment) Current home services: DME Criminal Activity/Legal Involvement Pertinent to Current Situation/Hospitalization: No - Comment as needed  Activities of Daily Living Home Assistive Devices/Equipment: Wheelchair ADL Screening (condition at time of admission) Patient's cognitive ability adequate to safely complete daily activities?: No Is the patient deaf or have difficulty hearing?: No Does the patient have difficulty seeing, even when wearing glasses/contacts?:  Yes Does the patient have difficulty concentrating, remembering, or making decisions?: No Patient able to express need for assistance with ADLs?: Yes Does the patient have difficulty dressing or bathing?: Yes Independently performs ADLs?: No Communication: Independent Dressing (OT): Needs assistance Is this a change from baseline?: Pre-admission baseline Grooming: Needs assistance Is this a change from baseline?: Pre-admission baseline Feeding: Needs assistance Is this a change from baseline?: Pre-admission baseline Bathing: Needs assistance Is this a change from baseline?: Pre-admission baseline Toileting: Needs assistance Is this a change from baseline?: Pre-admission baseline In/Out Bed: Needs assistance Is this a change from baseline?: Pre-admission baseline Walks in Home: Dependent Is this a change from baseline?: Change from baseline, expected to last <3 days Does the patient have difficulty walking or climbing stairs?: Yes Weakness of Legs: Both Weakness of Arms/Hands: Both  Permission Sought/Granted                  Emotional Assessment       Orientation: : Oriented to Self, Oriented to Place, Oriented to Situation Alcohol / Substance Use: Not Applicable Psych Involvement: No (comment)  Admission diagnosis:  Respiratory distress [R06.03] COVID-19 [U07.1] Acute on chronic respiratory failure with hypoxia ([place]) [J96.21] Patient Active Problem List   Diagnosis Date Noted  . Acute respiratory disease due to COVID-19 virus 05/08/2019  . Acute on chronic respiratory failure with hypoxia ([place]) 05/08/2019  .  COPD (chronic obstructive pulmonary disease) ([place]) 05/08/2019  . Atrial fibrillation, chronic ([place]) 05/08/2019  . COVID-19 virus infection 05/02/2019  . DNR (do not resuscitate) 05/02/2019  . GERD (gastroesophageal reflux disease) 05/02/2019  . Thrombocytopenia ([place]) 05/02/2019  . CKD (chronic kidney disease), stage IV ([place]) 05/02/2019  . History of CHF  (congestive heart failure) 05/02/2019  . Type 2 diabetes mellitus with polyneuropathy ([place]) 05/02/2019  . Essential hypertension 05/02/2019   PCP:  Bonnita Nasuti, MD Pharmacy:  No Pharmacies Listed    Social Determinants of Health (SDOH) Interventions    Readmission Risk Interventions Readmission Risk Prevention Plan 05/12/2019  Transportation Screening Complete  Medication Review (RN Care Manager) Complete  Some recent data might be hidden

## 2019-05-13 LAB — CBC WITH DIFFERENTIAL/PLATELET
Abs Immature Granulocytes: 0.62 10*3/uL — ABNORMAL HIGH (ref 0.00–0.07)
Basophils Absolute: 0.1 10*3/uL (ref 0.0–0.1)
Basophils Relative: 1 %
Eosinophils Absolute: 0 10*3/uL (ref 0.0–0.5)
Eosinophils Relative: 0 %
HCT: 34.7 % — ABNORMAL LOW (ref 36.0–46.0)
Hemoglobin: 11.3 g/dL — ABNORMAL LOW (ref 12.0–15.0)
Immature Granulocytes: 4 %
Lymphocytes Relative: 7 %
Lymphs Abs: 1 10*3/uL (ref 0.7–4.0)
MCH: 31 pg (ref 26.0–34.0)
MCHC: 32.6 g/dL (ref 30.0–36.0)
MCV: 95.1 fL (ref 80.0–100.0)
Monocytes Absolute: 1.9 10*3/uL — ABNORMAL HIGH (ref 0.1–1.0)
Monocytes Relative: 13 %
Neutro Abs: 11.2 10*3/uL — ABNORMAL HIGH (ref 1.7–7.7)
Neutrophils Relative %: 75 %
Platelets: 230 10*3/uL (ref 150–400)
RBC: 3.65 MIL/uL — ABNORMAL LOW (ref 3.87–5.11)
RDW: 16.7 % — ABNORMAL HIGH (ref 11.5–15.5)
WBC: 14.9 10*3/uL — ABNORMAL HIGH (ref 4.0–10.5)
nRBC: 0 % (ref 0.0–0.2)

## 2019-05-13 LAB — D-DIMER, QUANTITATIVE: D-Dimer, Quant: 0.95 ug/mL-FEU — ABNORMAL HIGH (ref 0.00–0.50)

## 2019-05-13 LAB — COMPREHENSIVE METABOLIC PANEL
ALT: 32 U/L (ref 0–44)
AST: 21 U/L (ref 15–41)
Albumin: 2.6 g/dL — ABNORMAL LOW (ref 3.5–5.0)
Alkaline Phosphatase: 56 U/L (ref 38–126)
Anion gap: 15 (ref 5–15)
BUN: 52 mg/dL — ABNORMAL HIGH (ref 8–23)
CO2: 28 mmol/L (ref 22–32)
Calcium: 8.9 mg/dL (ref 8.9–10.3)
Chloride: 96 mmol/L — ABNORMAL LOW (ref 98–111)
Creatinine, Ser: 1.6 mg/dL — ABNORMAL HIGH (ref 0.44–1.00)
GFR calc Af Amer: 35 mL/min — ABNORMAL LOW (ref >60)
GFR calc non Af Amer: 30 mL/min — ABNORMAL LOW (ref >60)
Glucose, Bld: 110 mg/dL — ABNORMAL HIGH (ref 70–99)
Potassium: 4.6 mmol/L (ref 3.5–5.1)
Sodium: 139 mmol/L (ref 135–145)
Total Bilirubin: 1 mg/dL (ref 0.3–1.2)
Total Protein: 6.3 g/dL — ABNORMAL LOW (ref 6.5–8.1)

## 2019-05-13 LAB — MAGNESIUM: Magnesium: 2 mg/dL (ref 1.7–2.4)

## 2019-05-13 LAB — BRAIN NATRIURETIC PEPTIDE: B Natriuretic Peptide: 125.3 pg/mL — ABNORMAL HIGH (ref 0.0–100.0)

## 2019-05-13 LAB — CULTURE, BLOOD (ROUTINE X 2)
Culture: NO GROWTH
Culture: NO GROWTH
Special Requests: ADEQUATE

## 2019-05-13 LAB — GLUCOSE, CAPILLARY: Glucose-Capillary: 108 mg/dL — ABNORMAL HIGH (ref 70–99)

## 2019-05-13 LAB — C-REACTIVE PROTEIN: CRP: 2.4 mg/dL — ABNORMAL HIGH (ref <1.0)

## 2019-05-13 MED ORDER — AMLODIPINE BESYLATE 10 MG PO TABS
10.0000 mg | ORAL_TABLET | Freq: Every day | ORAL | Status: DC
  Administered 2019-05-13: 10 mg via ORAL

## 2019-05-13 MED ORDER — DOXYCYCLINE HYCLATE 100 MG PO TABS: 100.0000 mg | ORAL_TABLET | Freq: Two times a day (BID) | ORAL | 0 refills | Status: AC

## 2019-05-13 MED ORDER — TRAMADOL HCL 50 MG PO TABS: 50.0000 mg | ORAL_TABLET | Freq: Two times a day (BID) | ORAL | 0 refills | Status: AC | PRN

## 2019-05-13 MED ORDER — AMOXICILLIN-POT CLAVULANATE 500-125 MG PO TABS: 1.0000 | ORAL_TABLET | Freq: Two times a day (BID) | ORAL | 0 refills | Status: AC

## 2019-05-13 NOTE — TOC Transition Note (Signed)
Transition of Care [facility]) - CM/SW Discharge Note   Patient Details  Name: SYAH FAMOUS MRN: [id number] Date of Birth: 1941/04/29  Transition [facility]) CM/SW Contact:  Amador Cunas, [place] Phone Number: 05/13/2019, 11:50 AM   Clinical Narrative:   Pt for d/c back to [facility] today. Spoke to [place] at [facility] who confirmed pt able to return. DC summary faxed to facility [phone]. Pt aware of dc and reports agreeable. SW signing off at d/c.   Wandra Feinstein, MSW, LCSW [phone] (GV coverage)       Final next level of care: Assisted Living Barriers to Discharge: No Barriers Identified   Patient Goals and CMS Choice        Discharge Placement              Patient chooses bed at: (Return to [facility]) Patient to be transferred to facility by: [facility] Name of family member notified: Pt declined family be notified Patient and family notified of of transfer: 05/13/19  Discharge Plan and Services                                     Social Determinants of Health ([place]) Interventions     Readmission Risk Interventions Readmission Risk Prevention Plan 05/12/2019  Transportation Screening Complete  Medication Review Press photographer) Complete  Some recent data might be hidden

## 2019-05-13 NOTE — Discharge Instructions (Signed)
Follow with Primary MD Bonnita Nasuti, MD in 7 days.  Stop all narcotics and benzodiazepines.  Get CBC, CMP, 2 view Chest X ray -  checked next visit within 1 week by Primary MD    Activity: As tolerated with Full fall precautions use walker/cane & assistance as needed  Disposition Home    Diet: Soft diet with feeding assistance and aspiration precautions.    Special Instructions: If you have smoked or chewed Tobacco  in the last 2 yrs please stop smoking, stop any regular Alcohol  and or any Recreational drug use.  On your next visit with your primary care physician please Get Medicines reviewed and adjusted.  Please request your Prim.MD to go over all Hospital Tests and Procedure/Radiological results at the follow up, please get all Hospital records sent to your Prim MD by signing hospital release before you go home.  If you experience worsening of your admission symptoms, develop shortness of breath, life threatening emergency, suicidal or homicidal thoughts you must seek medical attention immediately by calling 911 or calling your MD immediately  if symptoms less severe.  You Must read complete instructions/literature along with all the possible adverse reactions/side effects for all the Medicines you take and that have been prescribed to you. Take any new Medicines after you have completely understood and accpet all the possible adverse reactions/side effects.   Do not drive, operate heavy machinery, perform activities at heights, swimming or participation in water activities or provide baby sitting services if your were admitted for syncope or siezures until you have seen by Primary MD or a Neurologist and advised to do so again.      Person Under Monitoring Name: Joyce Ortiz  Location: [place] [address]   Infection Prevention Recommendations for Individuals Confirmed to have, or Being Evaluated for, 2019 Novel Coronavirus (COVID-19) Infection Who Receive  Care at Home  Individuals who are confirmed to have, or are being evaluated for, COVID-19 should follow the prevention steps below until a healthcare provider or local or state health department says they can return to normal activities.  Stay home except to get medical care You should restrict activities outside your home, except for getting medical care. Do not go to work, school, or public areas, and do not use public transportation or taxis.  Call ahead before visiting your doctor Before your medical appointment, call the healthcare provider and tell them that you have, or are being evaluated for, COVID-19 infection. This will help the healthcare provider's office take steps to keep other people from getting infected. Ask your healthcare provider to call the local or state health department.  Monitor your symptoms Seek prompt medical attention if your illness is worsening (e.g., difficulty breathing). Before going to your medical appointment, call the healthcare provider and tell them that you have, or are being evaluated for, COVID-19 infection. Ask your healthcare provider to call the local or state health department.  Wear a facemask You should wear a facemask that covers your nose and mouth when you are in the same room with other people and when you visit a healthcare provider. People who live with or visit you should also wear a facemask while they are in the same room with you.  Separate yourself from other people in your home As much as possible, you should stay in a different room from other people in your home. Also, you should use a separate bathroom, if available.  Avoid sharing household items  You should not share dishes, drinking glasses, cups, eating utensils, towels, bedding, or other items with other people in your home. After using these items, you should wash them thoroughly with soap and water.  Cover your coughs and sneezes Cover your mouth and nose with a  tissue when you cough or sneeze, or you can cough or sneeze into your sleeve. Throw used tissues in a lined trash can, and immediately wash your hands with soap and water for at least 20 seconds or use an alcohol-based hand rub.  Wash your [facility] your hands often and thoroughly with soap and water for at least 20 seconds. You can use an alcohol-based hand sanitizer if soap and water are not available and if your hands are not visibly dirty. Avoid touching your eyes, nose, and mouth with unwashed hands.   Prevention Steps for Caregivers and Household Members of Individuals Confirmed to have, or Being Evaluated for, COVID-19 Infection Being Cared for in the Home  If you live with, or provide care at home for, a person confirmed to have, or being evaluated for, COVID-19 infection please follow these guidelines to prevent infection:  Follow healthcare provider's instructions Make sure that you understand and can help the patient follow any healthcare provider instructions for all care.  Provide for the patient's basic needs You should help the patient with basic needs in the home and provide support for getting groceries, prescriptions, and other personal needs.  Monitor the patient's symptoms If they are getting sicker, call his or her medical provider and tell them that the patient has, or is being evaluated for, COVID-19 infection. This will help the healthcare provider's office take steps to keep other people from getting infected. Ask the healthcare provider to call the local or state health department.  Limit the number of people who have contact with the patient  If possible, have only one caregiver for the patient.  Other household members should stay in another home or place of residence. If this is not possible, they should stay  in another room, or be separated from the patient as much as possible. Use a separate bathroom, if available.  Restrict visitors who do not  have an essential need to be in the home.  Keep older adults, very young children, and other sick people away from the patient Keep older adults, very young children, and those who have compromised immune systems or chronic health conditions away from the patient. This includes people with chronic heart, lung, or kidney conditions, diabetes, and cancer.  Ensure good ventilation Make sure that shared spaces in the home have good air flow, such as from an air conditioner or an opened window, weather permitting.  Wash your hands often  Wash your hands often and thoroughly with soap and water for at least 20 seconds. You can use an alcohol based hand sanitizer if soap and water are not available and if your hands are not visibly dirty.  Avoid touching your eyes, nose, and mouth with unwashed hands.  Use disposable paper towels to dry your hands. If not available, use dedicated cloth towels and replace them when they become wet.  Wear a facemask and gloves  Wear a disposable facemask at all times in the room and gloves when you touch or have contact with the patient's blood, body fluids, and/or secretions or excretions, such as sweat, saliva, sputum, nasal mucus, vomit, urine, or feces.  Ensure the mask fits over your nose and mouth tightly, and do  not touch it during use.  Throw out disposable facemasks and gloves after using them. Do not reuse.  Wash your hands immediately after removing your facemask and gloves.  If your personal clothing becomes contaminated, carefully remove clothing and launder. Wash your hands after handling contaminated clothing.  Place all used disposable facemasks, gloves, and other waste in a lined container before disposing them with other household waste.  Remove gloves and wash your hands immediately after handling these items.  Do not share dishes, glasses, or other household items with the patient  Avoid sharing household items. You should not share  dishes, drinking glasses, cups, eating utensils, towels, bedding, or other items with a patient who is confirmed to have, or being evaluated for, COVID-19 infection.  After the person uses these items, you should wash them thoroughly with soap and water.  Wash laundry thoroughly  Immediately remove and wash clothes or bedding that have blood, body fluids, and/or secretions or excretions, such as sweat, saliva, sputum, nasal mucus, vomit, urine, or feces, on them.  Wear gloves when handling laundry from the patient.  Read and follow directions on labels of laundry or clothing items and detergent. In general, wash and dry with the warmest temperatures recommended on the label.  Clean all areas the individual has used often  Clean all touchable surfaces, such as counters, tabletops, doorknobs, bathroom fixtures, toilets, phones, keyboards, tablets, and bedside tables, every day. Also, clean any surfaces that may have blood, body fluids, and/or secretions or excretions on them.  Wear gloves when cleaning surfaces the patient has come in contact with.  Use a diluted bleach solution (e.g., dilute bleach with 1 part bleach and 10 parts water) or a household disinfectant with a label that says EPA-registered for coronaviruses. To make a bleach solution at home, add 1 tablespoon of bleach to 1 quart (4 cups) of water. For a larger supply, add  cup of bleach to 1 gallon (16 cups) of water.  Read labels of cleaning products and follow recommendations provided on product labels. Labels contain instructions for safe and effective use of the cleaning product including precautions you should take when applying the product, such as wearing gloves or eye protection and making sure you have good ventilation during use of the product.  Remove gloves and wash hands immediately after cleaning.  Monitor yourself for signs and symptoms of illness Caregivers and household members are considered close contacts,  should monitor their health, and will be asked to limit movement outside of the home to the extent possible. Follow the monitoring steps for close contacts listed on the symptom monitoring form.   ? If you have additional questions, contact your local health department or call the epidemiologist on call at [phone] (available 24/7). ? This guidance is subject to change. For the most up-to-date guidance from [facility], please refer to their website: YouBlogs.pl

## 2019-05-13 NOTE — Progress Notes (Signed)
Offered to call and update family. Patient declines calling and declines nurse calling stated that they have recently talked and do not need another update tonight.

## 2019-05-13 NOTE — Discharge Summary (Signed)
Joyce Ortiz [id number] DOB: 1941-01-22 DOA: 05/08/2019  PCP: Bonnita Nasuti, MD  Admit date: 05/08/2019  Discharge date: 05/13/2019  Admitted From: ALF   Disposition:  ALF   Recommendations for Outpatient Follow-up:   Follow up with PCP in 1-2 weeks  PCP Please obtain BMP/CBC, 2 view CXR in 1week,  (see Discharge instructions)   PCP Please follow up on the following pending results: none   Home Health: PT,RN,SLP,S-Work, Aide   Equipment/Devices: o2 2lit continue as before, home dose Consultations: None  Discharge Condition: Stable    CODE STATUS: Full    Diet Recommendation: Soft    Chief Complaint  Patient presents with  . Respiratory Distress     Brief history of present illness from the day of admission and additional interim summary    Joyce Ortiz a 78 y.o.femalewith medical history significant ofCVA; stage IV CKD; HTN; DM; COPD; CHF (uncertain systolic vs. Diastolic); chronic debility and dysphagia (has refused SLP evaluation or modification of diet); and afib (off Eliquis due to GI bleed in 04/2018) presenting with respiratory distress. She was previously admitted at [facility] from 12/3-8 with COVID-19-associated PNA, she was readmitted with hypoxia most likely due to superimposed bacterial pneumonia and CHF.                                                                 Hospital Course     1. Acute on Chronic Hypoxic Resp. Failure due to aspiration pneumonia in a patient with history of treated COVID-19 pneumonia- Covid has been treated, this was routine aspiration bacterial pneumonia with some element of CHF.  She had profound leukocytosis, elevated procalcitonin along with high proBNP.  She was treated with Lasix, antibiotics, speech therapy input and switch to his dysphagia 3 diet.  She is  now back to her baseline of 2 L nasal cannula oxygen and symptom-free, in my opinion her recurrent aspiration is ongoing due to narcotic use, narcotics have been stopped she and her daughter were counseled.  Will request PCP to avoid using narcotics or benzodiazepines in the future.  Her original COVID-19 diagnosis is 05/02/2019.  Continue full precautions at ALF [facility] 05/24/2019.   COVID-19 Labs  Recent Labs    05/11/19 0415 05/12/19 0152 05/13/19 0542  DDIMER 1.85* 1.44* 0.95*  CRP 11.7* 5.9* 2.4*    No results found for: SARSCOV2NAA  2.  AKI on CKD 4.  Baseline creatinine close to 2.  Stable now.  3.  COPD.  On 2 L nasal cannula home oxygen.  Supportive care.  No extra wheezing.  She seems to be chronically on steroids.  Continued home dose.  4.  Acute on chronic diastolic CHF EF [id number] on echocardiogram.  Continue IV Lasix and monitor.  Beta-blocker in place.  5.  Hypertension.  On Norvasc and labetalol, added hydralazine.  6.  GERD.  On PPI continue.  7.  History of paroxysmal atrial fibrillation.  Mali vas 2 score of greater than 3.  On labetalol, off anticoagulation due to GI bleed.  8.  Dyslipidemia.  Statin.  9.  History of dysphagia.  With new pneumonia speech will be requested to evaluate, patient has refused evaluation in the past.  10.  Anxiety and depression.  On home medications.  11.  Arthritis.  On methotrexate.  Hold narcotics.  Ultram ordered instead.  12.  Chronic pain and high-dose narcotic use at home.  Minimize.  Could be the underlying issue causing her to aspirate.      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[place])   Essential hypertension   Acute respiratory disease due to COVID-19 virus   COPD (chronic obstructive pulmonary disease)  (HCC)   Atrial fibrillation, chronic ([place])    Discharge instructions    Discharge Instructions    Discharge instructions   Complete by: As directed    [facility] [place] [id number]   Increase activity slowly   Complete by: As directed    MyChart COVID-19 home monitoring program   Complete by: May 13, 2019    Is the patient willing to use the [place] for home monitoring?: Yes   Temperature monitoring   Complete by: May 13, 2019    After how many days would you like to receive a notification of this patient's flowsheet entries?: 1      Discharge Medications   Allergies as of 05/13/2019      Reactions   Aspirin    Other reaction(s): GI Upset (intolerance)   Biaxin [clarithromycin] Other (See Comments)   [facility]    Codeine Nausea And Vomiting   Moxifloxacin Itching      Medication List    STOP taking these medications   oxycodone 5 MG capsule Commonly known as: OXY-IR     TAKE these medications   acetaminophen 325 MG tablet Commonly known as: TYLENOL Take 650 mg by mouth every 4 (four) hours as needed for mild pain.   amLODipine 10 MG tablet Commonly known as: NORVASC Take 10 mg by mouth daily.   amoxicillin-clavulanate 500-125 MG tablet Commonly known as: Augmentin Take 1 tablet (500 mg total) by mouth 2 (two) times daily.   atorvastatin 40 MG tablet Commonly known as: LIPITOR Take 40 mg by mouth daily.   bisacodyl 5 MG EC tablet Commonly known as: DULCOLAX Take 5 mg by mouth daily as needed for moderate constipation.   brimonidine 0.1 % Soln Commonly known as: ALPHAGAN P Place 1 drop into both eyes 3 (three) times daily.   doxycycline 100 MG tablet Commonly known as: VIBRA-TABS Take 1 tablet (100 mg total) by mouth every 12 (twelve) hours.   DULoxetine 60 MG capsule Commonly known as: CYMBALTA Take 60 mg by mouth daily.   escitalopram 5 MG tablet Commonly known as: LEXAPRO Take 5 mg by mouth daily.   folic  acid 1 MG tablet Commonly known as: FOLVITE Take 1 mg by mouth daily.   furosemide 40 MG tablet Commonly known as: LASIX Take 40 mg by mouth daily.   gabapentin 100 MG capsule Commonly known as:  NEURONTIN Take 100 mg by mouth 2 (two) times daily.   Icy Hot 5 % Ptch Generic drug: Menthol Apply 1 patch topically 2 (two) times daily.   ipratropium-albuterol 0.5-2.5 (3) MG/3ML Soln Commonly known as: DUONEB Take 3 mLs by nebulization every 6 (six) hours as needed (wheezing/SOB).   labetalol 100 MG tablet Commonly known as: NORMODYNE Take 0.5 tablets (50 mg total) by mouth 2 (two) times daily.   latanoprost 0.005 % ophthalmic solution Commonly known as: XALATAN Place 1 drop into both eyes at bedtime.   methotrexate 2.5 MG tablet Commonly known as: RHEUMATREX Take 7.5 mg by mouth every Monday.   multivitamins with iron Tabs tablet Take 1 tablet by mouth daily.   ondansetron 4 MG tablet Commonly known as: ZOFRAN Take 4 mg by mouth every 4 (four) hours as needed for nausea or vomiting.   pantoprazole 40 MG tablet Commonly known as: PROTONIX Take 40 mg by mouth daily.   predniSONE 5 MG tablet Commonly known as: DELTASONE Take 5 mg by mouth daily.   Systane 0.4-0.3 % Soln Generic drug: Polyethyl Glycol-Propyl Glycol Place 1 drop into both eyes 2 (two) times daily.   traMADol 50 MG tablet Commonly known as: ULTRAM Take 1 tablet (50 mg total) by mouth every 12 (twelve) hours as needed for moderate pain or severe pain.       Follow-up Information    Hague, [name], MD. Schedule an appointment as soon as possible for a visit in 1 week(s).   Specialty: Internal Medicine Contact information: [address] [place] [address] [phone]           Major procedures and Radiology Reports - PLEASE review detailed and final reports thoroughly  -         DG Chest Port 1 View  Result Date: 05/08/2019 CLINICAL DATA:  Shortness of breath, respiratory  distress, COVID (+), EXAM: PORTABLE CHEST 1 VIEW COMPARISON:  Chest radiograph 05/04/2019 FINDINGS: Heart size within normal limits.  Aortic atherosclerosis. Hazy opacity within the left mid and upper lung field. Redemonstrated left basilar pleuroparenchymal scarring. Please note the patient's chin partially obscures the right lung apex. No evidence of pneumothorax. No acute bony abnormality. Cervical spinal fusion hardware. Overlying cardiac monitoring leads. IMPRESSION: Hazy opacity within the left mid and upper lung field, suspicious for pneumonia given provided history. Redemonstrated left basilar pleuroparenchymal scarring. Aortic atherosclerosis. Electronically Signed   By: Kellie Simmering DO   On: 05/08/2019 07:50   DG Chest Port 1 View  Result Date: 05/04/2019 CLINICAL DATA:  Follow-up pneumonia due to COVID-19 EXAM: PORTABLE CHEST 1 VIEW COMPARISON:  05/02/2019 FINDINGS: Normal heart size. Aortic atherosclerosis the lungs are hyperinflated and there are coarsened interstitial markings of COPD/emphysema. Blunting of the left costophrenic angle is noted likely reflecting pleuroparenchymal scarring. No airspace consolidation. IMPRESSION: 1. No acute cardiopulmonary abnormalities. 2. Chronic interstitial coarsening and left base pleuroparenchymal scarring. Electronically Signed   By: Kerby Moors M.D.   On: 05/04/2019 11:38   DG Chest Port 1 View  Result Date: 05/02/2019 CLINICAL DATA:  Shortness of breath with cough and fever EXAM: PORTABLE CHEST 1 VIEW COMPARISON:  July 21, 2018 FINDINGS: There is mild bibasilar atelectasis. There is no edema or consolidation. Heart is upper normal in size with pulmonary vascularity normal. No adenopathy. There is aortic atherosclerosis. There is postoperative change in the lower cervical region. IMPRESSION: Bibasilar atelectasis. No edema or consolidation. Heart upper normal in size. No adenopathy. Aortic Atherosclerosis (ICD10-I70.0). Electronically  Signed   By:  Lowella Grip III M.D.   On: 05/02/2019 13:36   ECHOCARDIOGRAM COMPLETE  Result Date: 05/12/2019   ECHOCARDIOGRAM REPORT   Patient Name:   Ellon B Strauch Date of Exam: 05/10/2019 Medical Rec #:  [id number]    Height:       62.0 in Accession #:    [id number]   Weight:       144.4 lb Date of Birth:  26-May-1941    BSA:          1.66 m Patient Age:    78 years     BP:           105/68 mmHg Patient Gender: F            HR:           64 bpm. Exam Location:  Inpatient Procedure: 2D Echo Indications:    CHF-Acute Diastolic 0000000  History:        Patient has no prior history of Echocardiogram examinations.                 CHF, COPD, Arrythmias:Atrial Fibrillation; Risk                 Factors:Hypertension and Diabetes. Hx CVA. CKD.  Sonographer:    Clayton Lefort RDCS (AE) Referring Phys: [id number]intosh [place]  1. Left ventricular ejection fraction, by visual estimation, is 60 to 65%. The left ventricle has normal function. There is moderately increased left ventricular hypertrophy.  2. Elevated left atrial pressure.  3. Left ventricular diastolic parameters are consistent with Grade I diastolic dysfunction (impaired relaxation).  4. The left ventricle has no regional wall motion abnormalities.  5. Global right ventricle has normal systolic function.The right ventricular size is normal. No increase in right ventricular wall thickness.  6. Left atrial size was normal.  7. Right atrial size was normal.  8. The mitral valve is normal in structure. No evidence of mitral valve regurgitation. No evidence of mitral stenosis.  9. The tricuspid valve is not well visualized. Tricuspid valve regurgitation is trivial. 10. The aortic valve is tricuspid. Aortic valve regurgitation is mild. Mild aortic valve stenosis. 11. The pulmonic valve was not well visualized. Pulmonic valve regurgitation is not visualized. 12. Mildly elevated pulmonary artery systolic pressure. 13. The inferior vena cava is normal in size with  greater than 50% respiratory variability, suggesting right atrial pressure of 3 mmHg. 14. The interatrial septum was not well visualized. FINDINGS  Left Ventricle: Left ventricular ejection fraction, by visual estimation, is 60 to 65%. The left ventricle has normal function. The left ventricle has no regional wall motion abnormalities. There is moderately increased left ventricular hypertrophy. Left ventricular diastolic parameters are consistent with Grade I diastolic dysfunction (impaired relaxation). Elevated left atrial pressure. Right Ventricle: The right ventricular size is normal. No increase in right ventricular wall thickness. Global RV systolic function is has normal systolic function. The tricuspid regurgitant velocity is 2.66 m/s, and with an assumed right atrial pressure  of 8 mmHg, the estimated right ventricular systolic pressure is mildly elevated at 36.3 mmHg. Left Atrium: Left atrial size was normal in size. Right Atrium: Right atrial size was normal in size Pericardium: There is no evidence of pericardial effusion. Mitral Valve: The mitral valve is normal in structure. No evidence of mitral valve regurgitation. No evidence of mitral valve stenosis by observation. MV peak gradient, 3.9 mmHg. Tricuspid Valve: The tricuspid valve is not well visualized.  Tricuspid valve regurgitation is trivial. Aortic Valve: The aortic valve is tricuspid. Aortic valve regurgitation is mild. Aortic regurgitation PHT measures 959 msec. Mild aortic stenosis is present. Mild aortic valve annular calcification. There is mild calcification of the aortic valve. Aortic  valve mean gradient measures 11.2 mmHg. Aortic valve peak gradient measures 23.5 mmHg. Aortic valve area, by VTI measures 1.77 cm. Pulmonic Valve: The pulmonic valve was not well visualized. Pulmonic valve regurgitation is not visualized. Pulmonic regurgitation is not visualized. No evidence of pulmonic stenosis. Aorta: The aortic root is normal in size and  structure. Pulmonary Artery: Indeterminant PASP, inadequate TR jet. Venous: The inferior vena cava is normal in size with greater than 50% respiratory variability, suggesting right atrial pressure of 3 mmHg. IAS/Shunts: The interatrial septum was not well visualized.  LEFT VENTRICLE PLAX 2D LVIDd:         3.39 cm  Diastology LVIDs:         2.27 cm  LV e' lateral:   5.55 cm/s LV PW:         1.28 cm  LV E/e' lateral: 15.8 LV IVS:        1.34 cm  LV e' medial:    5.87 cm/s LVOT diam:     1.90 cm  LV E/e' medial:  14.9 LV SV:         30 ml LV SV Index:   17.30 LVOT Area:     2.84 cm  RIGHT VENTRICLE          IVC RV Basal diam:  2.44 cm  IVC diam: 1.86 cm LEFT ATRIUM             Index       RIGHT ATRIUM           Index LA diam:        3.00 cm 1.80 cm/m  RA Area:     11.00 cm LA Vol (A2C):   47.6 ml 28.60 ml/m RA Volume:   22.30 ml  13.40 ml/m LA Vol (A4C):   43.8 ml 26.31 ml/m LA Biplane Vol: 48.2 ml 28.96 ml/m  AORTIC VALVE AV Area (Vmax):    1.54 cm AV Area (Vmean):   1.68 cm AV Area (VTI):     1.77 cm AV Vmax:           242.40 cm/s AV Vmean:          154.200 cm/s AV VTI:            0.489 m AV Peak Grad:      23.5 mmHg AV Mean Grad:      11.2 mmHg LVOT Vmax:         132.00 cm/s LVOT Vmean:        91.100 cm/s LVOT VTI:          0.306 m LVOT/AV VTI ratio: 0.63 AI PHT:            959 msec  AORTA Ao Root diam: 2.70 cm Ao Asc diam:  3.30 cm MITRAL VALVE                         TRICUSPID VALVE MV Area (PHT): 2.39 cm              TR Peak grad:   28.3 mmHg MV Peak grad:  3.9 mmHg              TR Vmax:  267.00 cm/s MV Mean grad:  1.0 mmHg MV Vmax:       0.99 m/s              SHUNTS MV Vmean:      56.3 cm/s             Systemic VTI:  0.31 m MV VTI:        0.35 m                Systemic Diam: 1.90 cm MV PHT:        91.93 msec MV Decel Time: 317 msec MV E velocity: 87.50 cm/s  103 cm/s MV A velocity: 126.00 cm/s 70.3 cm/s MV E/A ratio:  0.69        1.5  Carlyle Dolly MD Electronically signed by Carlyle Dolly  MD Signature Date/Time: 05/12/2019/9:54:07 AM    Final    [place] EKG SITE RITE  Result Date: 05/08/2019 If Site Rite image not attached, placement could not be confirmed due to current cardiac rhythm.   Micro Results     Recent Results (from the past 240 hour(s))  Blood Culture (routine x 2)     Status: None   Collection Time: 05/08/19  8:05 AM   Specimen: BLOOD  Result Value Ref Range Status   Specimen Description BLOOD RIGHT ANTECUBITAL  Final   Special Requests   Final    BOTTLES DRAWN AEROBIC AND ANAEROBIC Blood Culture adequate volume   Culture   Final    NO GROWTH 5 DAYS Performed at [facility], [address], [place], [place] [address]    Report Status 05/13/2019 FINAL  Final  Blood Culture (routine x 2)     Status: None   Collection Time: 05/08/19  8:28 AM   Specimen: BLOOD RIGHT FOREARM  Result Value Ref Range Status   Specimen Description BLOOD RIGHT FOREARM  Final   Special Requests   Final    BOTTLES DRAWN AEROBIC ONLY Blood Culture results may not be optimal due to an inadequate volume of blood received in culture bottles   Culture   Final    NO GROWTH 5 DAYS Performed at [facility], [place] [address] [place], [place] [address]    Report Status 05/13/2019 FINAL  Final  MRSA PCR Screening     Status: Abnormal   Collection Time: 05/09/19  6:37 AM  Result Value Ref Range Status   MRSA by PCR POSITIVE (A) NEGATIVE Final    Comment:        The GeneXpert MRSA Assay (FDA approved for NASAL specimens only), is one component of a comprehensive MRSA colonization surveillance program. It is not intended to diagnose MRSA infection nor to guide or monitor treatment for MRSA infections. RESULT CALLED TO, READ BACK BY AND VERIFIED WITH: Ambrose Pancoast. RN @1812  ON 12.10.2020 BY COHEN,K Performed at [facility], [place] [address], [place], [place] [address]     Today   Subjective    Vance Gather today has no headache,no chest abdominal  pain,no new weakness tingling or numbness, feels much better wants to go home today.     Objective   Blood pressure 145/90, pulse 60, temperature 98.4 F (36.9 C), temperature source Oral, resp. rate (!) 22, height 5\' 2"  (1.575 m), weight 64.9 kg, SpO2 97 %.   Intake/Output Summary (Last 24 hours) at 05/13/2019 0913 Last data filed at 05/13/2019 0853 Gross per 24 hour  Intake 103 ml  Output 0  ml  Net 103 ml    Exam Awake Alert,  No new F.N deficits, Normal affect [place].AT,PERRAL Supple Neck,No JVD, No cervical lymphadenopathy appriciated.  Symmetrical Chest wall movement, Good air movement bilaterally, CTAB RRR,No Gallops,Rubs or new Murmurs, No Parasternal Heave +ve B.Sounds, Abd Soft, Non tender, No organomegaly appriciated, No rebound -guarding or rigidity. No Cyanosis, Clubbing or edema, No new Rash or bruise   Data Review   CBC w Diff:  Lab Results  Component Value Date   WBC 14.0 (H) 05/12/2019   HGB 10.4 (L) 05/12/2019   HCT 27.7 (L) 05/12/2019   PLT 234 05/12/2019   LYMPHOPCT 4 05/12/2019   BANDSPCT 25 05/09/2019   MONOPCT 8 05/12/2019   EOSPCT 0 05/12/2019   BASOPCT 1 05/12/2019    CMP:  Lab Results  Component Value Date   NA 139 05/13/2019   K 4.6 05/13/2019   CL 96 (L) 05/13/2019   CO2 28 05/13/2019   BUN 52 (H) 05/13/2019   CREATININE 1.60 (H) 05/13/2019   PROT 6.3 (L) 05/13/2019   ALBUMIN 2.6 (L) 05/13/2019   BILITOT 1.0 05/13/2019   ALKPHOS 56 05/13/2019   AST 21 05/13/2019   ALT 32 05/13/2019  .   Total Time in preparing paper work, data evaluation and todays exam - 14 minutes  Lala Lund M.D on 05/13/2019 at 9:13 AM  [facility]   Office  [phone]

## 2021-01-21 ENCOUNTER — Ambulatory Visit: Payer: Medicare Other | Admitting: Podiatry

## 2021-01-26 IMAGING — DX DG CHEST 1V PORT
1 series · 1 of 1 positions shown · non-contrast
Comparison: Chest radiograph 05/04/2019

CLINICAL DATA: Shortness of breath, respiratory distress, COVID
(+),

EXAM:
PORTABLE CHEST 1 VIEW

[chest ap]
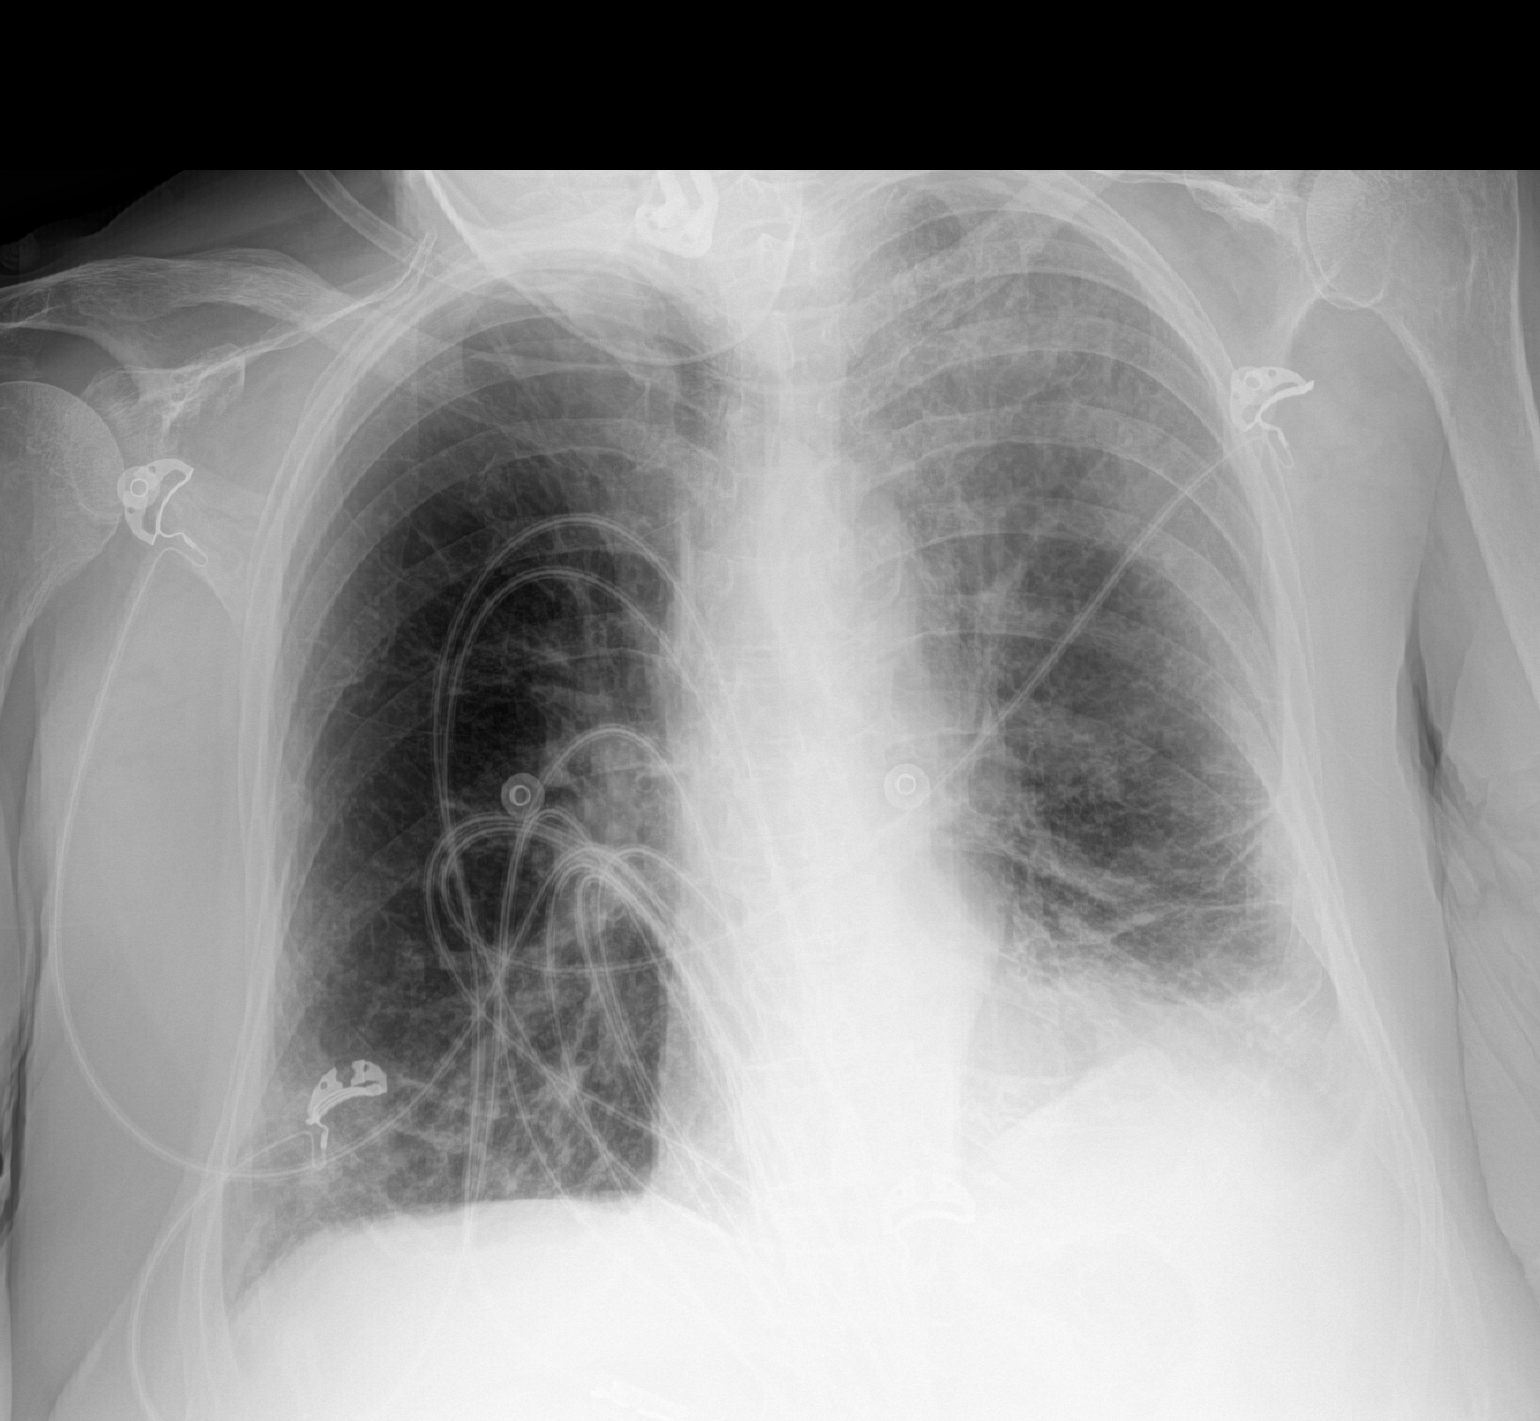

[1 of 1 positions shown; findings below may reference images not displayed]

FINDINGS: Heart size within normal limits.  Aortic atherosclerosis.

Hazy opacity within the left mid and upper lung field.
Redemonstrated left basilar pleuroparenchymal scarring. Please note
the patient's chin partially obscures the right lung apex. No
evidence of pneumothorax. No acute bony abnormality. Cervical spinal
fusion hardware. Overlying cardiac monitoring leads.
IMPRESSION: Hazy opacity within the left mid and upper lung field, suspicious
for pneumonia given provided history.

Redemonstrated left basilar pleuroparenchymal scarring.

Aortic atherosclerosis.

## 2021-02-15 ENCOUNTER — Ambulatory Visit: Payer: Medicare Other | Admitting: Podiatry

## 2022-01-29 ENCOUNTER — Encounter (HOSPITAL_COMMUNITY): Payer: Self-pay

## 2022-01-30 DIAGNOSIS — I351 Nonrheumatic aortic (valve) insufficiency: Secondary | ICD-10-CM | POA: Diagnosis not present

## 2022-01-30 DIAGNOSIS — I361 Nonrheumatic tricuspid (valve) insufficiency: Secondary | ICD-10-CM | POA: Diagnosis not present

## 2022-02-27 DEATH — deceased
